# Patient Record
Sex: Female | Born: 1965 | Hispanic: No | Marital: Single | Smoking: Never smoker
Health system: Southern US, Community
[De-identification: ages and names within clinical notes are randomized; demographics above are authoritative.]

## PROBLEM LIST (undated history)

## (undated) DIAGNOSIS — J45909 Unspecified asthma, uncomplicated: Secondary | ICD-10-CM

## (undated) DIAGNOSIS — F329 Major depressive disorder, single episode, unspecified: Secondary | ICD-10-CM

## (undated) DIAGNOSIS — F32A Depression, unspecified: Secondary | ICD-10-CM

## (undated) DIAGNOSIS — F4310 Post-traumatic stress disorder, unspecified: Secondary | ICD-10-CM

## (undated) DIAGNOSIS — F419 Anxiety disorder, unspecified: Secondary | ICD-10-CM

## (undated) HISTORY — PX: BREAST REDUCTION SURGERY: SHX8

## (undated) HISTORY — PX: TUBAL LIGATION: SHX77

## (undated) HISTORY — PX: REDUCTION MAMMAPLASTY: SUR839

## (undated) HISTORY — PX: BACK SURGERY: SHX140

---

## 2016-07-04 ENCOUNTER — Emergency Department (HOSPITAL_COMMUNITY)
Admission: EM | Admit: 2016-07-04 | Discharge: 2016-07-04 | Disposition: A | Discharge status: Home / Self Care | Payer: Medicaid Other | Attending: Emergency Medicine | Admitting: Emergency Medicine

## 2016-07-04 ENCOUNTER — Encounter (HOSPITAL_COMMUNITY): Payer: Self-pay | Admitting: Emergency Medicine

## 2016-07-04 DIAGNOSIS — Z79899 Other long term (current) drug therapy: Secondary | ICD-10-CM | POA: Diagnosis not present

## 2016-07-04 DIAGNOSIS — N921 Excessive and frequent menstruation with irregular cycle: Secondary | ICD-10-CM | POA: Diagnosis not present

## 2016-07-04 DIAGNOSIS — L259 Unspecified contact dermatitis, unspecified cause: Secondary | ICD-10-CM | POA: Diagnosis not present

## 2016-07-04 DIAGNOSIS — J45909 Unspecified asthma, uncomplicated: Secondary | ICD-10-CM | POA: Insufficient documentation

## 2016-07-04 DIAGNOSIS — R21 Rash and other nonspecific skin eruption: Secondary | ICD-10-CM | POA: Diagnosis present

## 2016-07-04 HISTORY — DX: Unspecified asthma, uncomplicated: J45.909

## 2016-07-04 LAB — I-STAT CHEM 8, ED
BUN: 19 mg/dL (ref 6–20)
CREATININE: 0.7 mg/dL (ref 0.44–1.00)
Calcium, Ion: 1.26 mmol/L (ref 1.13–1.30)
Chloride: 104 mmol/L (ref 101–111)
GLUCOSE: 94 mg/dL (ref 65–99)
HEMATOCRIT: 35 % — AB (ref 36.0–46.0)
HEMOGLOBIN: 11.9 g/dL — AB (ref 12.0–15.0)
POTASSIUM: 3.6 mmol/L (ref 3.5–5.1)
Sodium: 141 mmol/L (ref 135–145)
TCO2: 24 mmol/L (ref 0–100)

## 2016-07-04 LAB — URINALYSIS, ROUTINE W REFLEX MICROSCOPIC
Bilirubin Urine: NEGATIVE
GLUCOSE, UA: NEGATIVE mg/dL
KETONES UR: NEGATIVE mg/dL
LEUKOCYTES UA: NEGATIVE
NITRITE: NEGATIVE
PH: 5.5 (ref 5.0–8.0)
Protein, ur: NEGATIVE mg/dL
SPECIFIC GRAVITY, URINE: 1.028 (ref 1.005–1.030)

## 2016-07-04 LAB — URINE MICROSCOPIC-ADD ON

## 2016-07-04 LAB — WET PREP, GENITAL
CLUE CELLS WET PREP: NONE SEEN
SPERM: NONE SEEN
TRICH WET PREP: NONE SEEN
Yeast Wet Prep HPF POC: NONE SEEN

## 2016-07-04 LAB — PREGNANCY, URINE: Preg Test, Ur: NEGATIVE

## 2016-07-04 MED ORDER — HYDROXYZINE HCL 25 MG PO TABS
25.0000 mg | ORAL_TABLET | Freq: Once | ORAL | Status: AC
  Administered 2016-07-04: 25 mg via ORAL
  Filled 2016-07-04: qty 1

## 2016-07-04 MED ORDER — PREDNISONE 20 MG PO TABS
60.0000 mg | ORAL_TABLET | Freq: Once | ORAL | Status: AC
  Administered 2016-07-04: 60 mg via ORAL
  Filled 2016-07-04: qty 3

## 2016-07-04 MED ORDER — PREDNISONE 20 MG PO TABS: 40.0000 mg | ORAL_TABLET | Freq: Every day | ORAL | 0 refills | Status: DC

## 2016-07-04 MED ORDER — HYDROCORTISONE 2.5 % EX LOTN: TOPICAL_LOTION | Freq: Two times a day (BID) | CUTANEOUS | 0 refills | Status: DC

## 2016-07-04 MED ORDER — HYDROXYZINE HCL 25 MG PO TABS: 25.0000 mg | ORAL_TABLET | Freq: Four times a day (QID) | ORAL | 0 refills | Status: DC | PRN

## 2016-07-04 NOTE — Progress Notes (Signed)
Patient listed as not having insurance or a pcp.  EDCM went to speak to patient at bedside, however, EDP at bedside performing procedure.

## 2016-07-04 NOTE — ED Triage Notes (Signed)
Patient c/o rash on neck that is starting to spread around neck and chest that has been getting worse over week. 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  Patient states itching and burning rash.  Patient also c/o that her menstrual cycle has been going on for 3 weeks straight.  C/o lower back pain x several months.

## 2016-07-04 NOTE — ED Provider Notes (Signed)
[facility] DEPT Provider Note   CSN: [id number] Arrival date & time: 07/04/16  1647  First Provider Contact:  First MD Initiated Contact with Patient 07/04/16 2010    By signing my name below, I, [name], attest that this documentation has been prepared under the direction and in the presence of [employer], PA-C. Electronically Signed: [name], ED Scribe. 07/04/16. 8:17 PM.    History   Chief Complaint Chief Complaint  Patient presents with  . Rash  . Menstrual Problem  . Back Pain    HPI Daisy Myers is a 50 y.o. female.  HPI HPI Comments: Daisy Myers is a 50 y.o. female who presents to the Emergency Department complaining of worsening burning and itching rash on her neck for the last 3 weeks. She states that the rash has been worse over the last week and spreading down onto her chest. She states that she believes that the rash may be from the chemicals she uses at work. Denies any treatments tried. Pt reports similar rash when she eats acidic foods. Denies drainage, fever, SOB, facial swelling, nausea or vomiting. Denies any new foods or medications.   Pt also reports irregular menstrual cycle. Pt states that she has been on her menstrual cycle for the last 3 weeks. Pt states that is using about 7 pads per day. Pt reports that she also has some back pain. She states that she is also passing clots. Pt denies recent sexual activity. No dyspareunia. She further denies vomiting, dysuria, syncope, SOB, or light headedness. Pt states that she does not have an OBGYN in [place].   Past Medical History:  Diagnosis Date  . Asthma     There are no active problems to display for this patient.   Past Surgical History:  Procedure Laterality Date  . BACK SURGERY    . BREAST REDUCTION SURGERY    . CESAREAN SECTION    . TUBAL LIGATION      OB History    No data available       Home Medications    Prior to Admission medications   Medication Sig Start  Date End Date Taking? Authorizing Provider  Fluticasone-Salmeterol (ADVAIR) 500-50 MCG/DOSE AEPB Inhale 1 puff into the lungs 2 (two) times daily.   Yes Historical Provider, MD  ibuprofen (ADVIL,MOTRIN) 200 MG tablet Take 400 mg by mouth every 6 (six) hours as needed for headache, mild pain or moderate pain.   Yes Historical Provider, MD  hydrocortisone 2.5 % lotion Apply topically 2 (two) times daily. Do not apply to face 07/04/16   Antonietta Breach, PA-C  hydrOXYzine (ATARAX/VISTARIL) 25 MG tablet Take 1 tablet (25 mg total) by mouth every 6 (six) hours as needed for itching. 07/04/16   Antonietta Breach, PA-C  predniSONE (DELTASONE) 20 MG tablet Take 2 tablets (40 mg total) by mouth daily. Take 40 mg by mouth daily for 3 days, then 20mg  by mouth daily for 3 days, then 10mg  daily for 3 days 07/04/16   Antonietta Breach, PA-C    Family History No family history on file.  Social History Social History  Substance Use Topics  . Smoking status: Never Smoker  . Smokeless tobacco: Never Used  . Alcohol use No     Allergies   Review of patient's allergies indicates no known allergies.   Review of Systems Review of Systems  Constitutional: Negative for fever.  Respiratory: Negative for shortness of breath.   Gastrointestinal: Negative for nausea and vomiting.  Genitourinary: Positive  for vaginal bleeding. Negative for dysuria.  Skin: Positive for rash.  Neurological: Negative for light-headedness.  All other systems reviewed and are negative.    Physical Exam Updated Vital Signs BP 112/64   Pulse 66   Temp 98.7 F (37.1 C) (Oral)   Resp 20   LMP 06/13/2016   SpO2 100%   Physical Exam  Constitutional: She is oriented to person, place, and time. She appears well-developed and well-nourished. No distress.  Nontoxic appearing and in no distress.  HENT:  Head: Normocephalic and atraumatic.  No angioedema. Patient tolerating secretions without difficulty. No tripoding or stridor.  Eyes:  Conjunctivae and EOM are normal. No scleral icterus.  Neck: Normal range of motion.  No nuchal rigidity or meningismus.  Pulmonary/Chest: Effort normal. No respiratory distress.  Respirations even and unlabored.  Genitourinary: There is no rash, tenderness, lesion or injury on the right labia. There is no rash, tenderness, lesion or injury on the left labia. Uterus is not tender. Cervix exhibits no motion tenderness. Right adnexum displays no mass and no tenderness. Left adnexum displays no mass and no tenderness. There is bleeding (blood in vaginal vault c/w menses) in the vagina. No erythema or tenderness in the vagina. No foreign body in the vagina.  Genitourinary Comments: Uterus palpated to be firm, raising suspicion for fibroids.  Musculoskeletal: Normal range of motion.  Neurological: She is alert and oriented to person, place, and time.  Skin: Skin is warm and dry. Rash noted. She is not diaphoretic. No erythema. No pallor.  Pruritic, mildly erythematous, macular and punctate papular rash to upper chest and lower neck anteriorly. No skin peeling, weeping, drainage, bullae. Negative Nikolsky's sign.  Psychiatric: She has a normal mood and affect. Her behavior is normal.  Nursing note and vitals reviewed.    ED Treatments / Results  DIAGNOSTIC STUDIES: Oxygen Saturation is 99% on RA, normal by my interpretation.    COORDINATION OF CARE: 8:18 PM-Discussed treatment plan which includes prednisone and pelvic exam with pt at bedside and pt agreed to plan.     Labs (all labs ordered are listed, but only abnormal results are displayed) Labs Reviewed  WET PREP, GENITAL - Abnormal; Notable for the following:       Result Value   WBC, Wet Prep HPF POC MODERATE (*)    All other components within normal limits  URINALYSIS, ROUTINE W REFLEX MICROSCOPIC (NOT AT [facility]) - Abnormal; Notable for the following:    APPearance CLOUDY (*)    Hgb urine dipstick LARGE (*)    All other components  within normal limits  URINE MICROSCOPIC-ADD ON - Abnormal; Notable for the following:    Squamous Epithelial / LPF 6-30 (*)    Bacteria, UA MANY (*)    All other components within normal limits  I-STAT CHEM 8, ED - Abnormal; Notable for the following:    Hemoglobin 11.9 (*)    HCT 35.0 (*)    All other components within normal limits  PREGNANCY, URINE  GC/CHLAMYDIA PROBE AMP ([place]) NOT AT [facility]    EKG  EKG Interpretation None       Radiology No results found.  Procedures Procedures (including critical care time)  Medications Ordered in ED Medications  predniSONE (DELTASONE) tablet 60 mg (60 mg Oral Given 07/04/16 2037)  hydrOXYzine (ATARAX/VISTARIL) tablet 25 mg (25 mg Oral Given 07/04/16 2037)     Initial Impression / Assessment and Plan / ED Course  I have reviewed the triage vital signs  and the nursing notes.  Pertinent labs & imaging results that were available during my care of the patient were reviewed by me and considered in my medical decision making (see chart for details).  Clinical Course    Patient presents to the emergency department for 2 complaints. She expresses most concern over a rash to her neck x 3 weeks which she feels has been worsening over the past week. She states that she has been using new chemicals at her job. She has had no difficulty swallowing or breathing. No associated fever or drainage from the rash. There is no evidence of secondary infection or cellulitis today. Rash consistent mostly with a contact dermatitis. Plan to manage supportively with a prednisone taper. Atarax prescribed for itching. Will give referral to dermatology.  Patient also complaining of 3 weeks of persistent vaginal bleeding. She has no complaints of abdominal pain, nausea, vomiting, shortness of breath, lightheadedness, or syncope. Hemoglobin is only slightly below baseline. Orthostatics stable. Suspect that bleeding may be secondary to fibroids. No indication for  further emergent workup of menometrorrhagia. Patient referred to an OB/GYN for follow-up.   Return precautions discussed and provided. Patient discharged in satisfactory condition with no unaddressed concerns.   Final Clinical Impressions(s) / ED Diagnoses   Final diagnoses:  Menometrorrhagia  Contact dermatitis    New Prescriptions Discharge Medication List as of 07/04/2016 11:06 PM    START taking these medications   Details  hydrocortisone 2.5 % lotion Apply topically 2 (two) times daily. Do not apply to face, Starting Wed 07/04/2016, Print    hydrOXYzine (ATARAX/VISTARIL) 25 MG tablet Take 1 tablet (25 mg total) by mouth every 6 (six) hours as needed for itching., Starting Wed 07/04/2016, Print    predniSONE (DELTASONE) 20 MG tablet Take 2 tablets (40 mg total) by mouth daily. Take 40 mg by mouth daily for 3 days, then 20mg  by mouth daily for 3 days, then 10mg  daily for 3 days, Starting Wed 07/04/2016, Print        I personally performed the services described in this documentation, which was scribed in my presence. The recorded information has been reviewed and is accurate.       Antonietta Breach, PA-C 07/05/16 [id number]    Daleen Bo, MD 07/06/16 [phone]

## 2016-07-05 LAB — GC/CHLAMYDIA PROBE AMP (~~LOC~~) NOT AT ARMC
CHLAMYDIA, DNA PROBE: NEGATIVE
Neisseria Gonorrhea: NEGATIVE

## 2016-08-06 ENCOUNTER — Encounter: Payer: Medicaid Other | Admitting: Obstetrics & Gynecology

## 2016-10-24 ENCOUNTER — Other Ambulatory Visit: Payer: Self-pay | Admitting: Internal Medicine

## 2016-10-24 DIAGNOSIS — Z1231 Encounter for screening mammogram for malignant neoplasm of breast: Secondary | ICD-10-CM

## 2016-10-29 ENCOUNTER — Ambulatory Visit
Admission: RE | Admit: 2016-10-29 | Discharge: 2016-10-29 | Disposition: A | Discharge status: Home / Self Care | Payer: Medicaid Other | Source: Ambulatory Visit | Attending: Internal Medicine | Admitting: Internal Medicine

## 2016-10-29 ENCOUNTER — Other Ambulatory Visit: Payer: Self-pay | Admitting: Internal Medicine

## 2016-10-29 DIAGNOSIS — A1801 Tuberculosis of spine: Secondary | ICD-10-CM

## 2016-10-29 DIAGNOSIS — E2839 Other primary ovarian failure: Secondary | ICD-10-CM

## 2016-10-29 DIAGNOSIS — M25561 Pain in right knee: Secondary | ICD-10-CM

## 2016-11-13 ENCOUNTER — Ambulatory Visit
Admission: RE | Admit: 2016-11-13 | Discharge: 2016-11-13 | Disposition: A | Discharge status: Home / Self Care | Payer: Medicaid Other | Source: Ambulatory Visit | Attending: Internal Medicine | Admitting: Internal Medicine

## 2016-11-13 DIAGNOSIS — Z1231 Encounter for screening mammogram for malignant neoplasm of breast: Secondary | ICD-10-CM

## 2016-11-13 DIAGNOSIS — E2839 Other primary ovarian failure: Secondary | ICD-10-CM

## 2016-11-21 ENCOUNTER — Other Ambulatory Visit: Payer: Self-pay | Admitting: Internal Medicine

## 2016-11-21 DIAGNOSIS — R928 Other abnormal and inconclusive findings on diagnostic imaging of breast: Secondary | ICD-10-CM

## 2016-11-22 ENCOUNTER — Encounter: Payer: Self-pay | Admitting: Internal Medicine

## 2016-11-28 ENCOUNTER — Ambulatory Visit
Admission: RE | Admit: 2016-11-28 | Discharge: 2016-11-28 | Disposition: A | Discharge status: Home / Self Care | Payer: Medicaid Other | Source: Ambulatory Visit | Attending: Internal Medicine | Admitting: Internal Medicine

## 2016-11-28 ENCOUNTER — Other Ambulatory Visit: Payer: Self-pay | Admitting: Internal Medicine

## 2016-11-28 DIAGNOSIS — R928 Other abnormal and inconclusive findings on diagnostic imaging of breast: Secondary | ICD-10-CM

## 2016-11-28 DIAGNOSIS — N6002 Solitary cyst of left breast: Secondary | ICD-10-CM

## 2016-12-04 ENCOUNTER — Ambulatory Visit
Admission: RE | Admit: 2016-12-04 | Discharge: 2016-12-04 | Disposition: A | Discharge status: Home / Self Care | Payer: Medicaid Other | Source: Ambulatory Visit | Attending: Internal Medicine | Admitting: Internal Medicine

## 2016-12-04 DIAGNOSIS — N6002 Solitary cyst of left breast: Secondary | ICD-10-CM

## 2017-01-07 ENCOUNTER — Ambulatory Visit (AMBULATORY_SURGERY_CENTER): Payer: Self-pay

## 2017-01-07 VITALS — Ht 60.0 in | Wt 174.4 lb

## 2017-01-07 DIAGNOSIS — Z1211 Encounter for screening for malignant neoplasm of colon: Secondary | ICD-10-CM

## 2017-01-07 MED ORDER — SUPREP BOWEL PREP KIT 17.5-3.13-1.6 GM/177ML PO SOLN: 1.0000 | Freq: Once | ORAL | 0 refills | Status: AC

## 2017-01-07 NOTE — Progress Notes (Signed)
No allergies to eggs or soy No diet meds No home oxygen No past problems with anesthesia  Declined emmi 

## 2017-01-21 ENCOUNTER — Ambulatory Visit (AMBULATORY_SURGERY_CENTER): Payer: Medicaid Other | Admitting: Internal Medicine

## 2017-01-21 ENCOUNTER — Encounter: Payer: Self-pay | Admitting: Internal Medicine

## 2017-01-21 VITALS — BP 141/69 | HR 69 | Temp 97.8°F | Resp 12 | Ht 60.0 in | Wt 174.0 lb

## 2017-01-21 DIAGNOSIS — Z1212 Encounter for screening for malignant neoplasm of rectum: Secondary | ICD-10-CM | POA: Diagnosis not present

## 2017-01-21 DIAGNOSIS — K635 Polyp of colon: Secondary | ICD-10-CM

## 2017-01-21 DIAGNOSIS — Z1211 Encounter for screening for malignant neoplasm of colon: Secondary | ICD-10-CM

## 2017-01-21 DIAGNOSIS — D123 Benign neoplasm of transverse colon: Secondary | ICD-10-CM

## 2017-01-21 MED ORDER — SODIUM CHLORIDE 0.9 % IV SOLN: 500.0000 mL | INTRAVENOUS | Status: DC

## 2017-01-21 NOTE — Progress Notes (Signed)
Report given to PACU, vss 

## 2017-01-21 NOTE — Op Note (Signed)
[facility] Patient Name: Daisy Myers Procedure Date: 01/21/2017 1:22 PM MRN: [id number] Endoscopist: Docia Chuck. Henrene Pastor , MD Age: 51 Referring MD:  Date of Birth: 06-02-1966 Gender: Female Account #: [id number] Procedure:                Colonoscopy with cold snare polypectomy x 1 Indications:              Screening for colorectal malignant neoplasm Medicines:                Monitored Anesthesia Care Procedure:                Pre-Anesthesia Assessment:                           - Prior to the procedure, a History and Physical                            was performed, and patient medications and                            allergies were reviewed. The patient's tolerance of                            previous anesthesia was also reviewed. The risks                            and benefits of the procedure and the sedation                            options and risks were discussed with the patient.                            All questions were answered, and informed consent                            was obtained. Prior Anticoagulants: The patient has                            taken no previous anticoagulant or antiplatelet                            agents. ASA Grade Assessment: I - A normal, healthy                            patient. After reviewing the risks and benefits,                            the patient was deemed in satisfactory condition to                            undergo the procedure.                           After obtaining informed consent, the colonoscope  was passed under direct vision. Throughout the                            procedure, the patient's blood pressure, pulse, and                            oxygen saturations were monitored continuously. The                            Model CF-HQ190L [id number]) scope was introduced                            through the anus and advanced to the the cecum,   identified by appendiceal orifice and ileocecal                            valve. The ileocecal valve, appendiceal orifice,                            and rectum were photographed. The quality of the                            bowel preparation was excellent. The colonoscopy                            was performed without difficulty. The patient                            tolerated the procedure well. The bowel preparation                            used was SUPREP. Scope In: 1:29:35 PM Scope Out: 1:44:19 PM Scope Withdrawal Time: 0 hours 11 minutes 45 seconds  Total Procedure Duration: 0 hours 14 minutes 44 seconds  Findings:                 A 1 mm polyp was found in the transverse colon. The                            polyp was removed with a cold snare. Resection and                            retrieval were complete.                           The exam was otherwise without abnormality on                            direct and retroflexion views. Complications:            No immediate complications. Estimated blood loss:                            None. Estimated Blood Loss:     Estimated blood loss: none. Impression:               -  One 1 mm polyp in the transverse colon, removed                            with a cold snare. Resected and retrieved.                           - The examination was otherwise normal on direct                            and retroflexion views. Recommendation:           - Repeat colonoscopy in 5-10 years for surveillance.                           - Patient has a contact number available for                            emergencies. The signs and symptoms of potential                            delayed complications were discussed with the                            patient. Return to normal activities tomorrow.                            Written discharge instructions were provided to the                            patient.                           -  Resume previous diet.                           - Continue present medications.                           - Await pathology results. Docia Chuck. Henrene Pastor, MD 01/21/2017 1:49:10 PM This report has been signed electronically.

## 2017-01-21 NOTE — Patient Instructions (Signed)
Impression/Recommendations:  Polyp handout given to patient.  Repeat colonoscopy in 5-10 years for surveillance.  YOU HAD AN ENDOSCOPIC PROCEDURE TODAY AT [facility]:   Refer to the procedure report that was given to you for any specific questions about what was found during the examination.  If the procedure report does not answer your questions, please call your gastroenterologist to clarify.  If you requested that your care partner not be given the details of your procedure findings, then the procedure report has been included in a sealed envelope for you to review at your convenience later.  YOU SHOULD EXPECT: Some feelings of bloating in the abdomen. Passage of more gas than usual.  Walking can help get rid of the air that was put into your GI tract during the procedure and reduce the bloating. If you had a lower endoscopy (such as a colonoscopy or flexible sigmoidoscopy) you may notice spotting of blood in your stool or on the toilet paper. If you underwent a bowel prep for your procedure, you may not have a normal bowel movement for a few days.  Please Note:  You might notice some irritation and congestion in your nose or some drainage.  This is from the oxygen used during your procedure.  There is no need for concern and it should clear up in a day or so.  SYMPTOMS TO REPORT IMMEDIATELY:   Following lower endoscopy (colonoscopy or flexible sigmoidoscopy):  Excessive amounts of blood in the stool  Significant tenderness or worsening of abdominal pains  Swelling of the abdomen that is new, acute  Fever of 100F or higher For urgent or emergent issues, a gastroenterologist can be reached at any hour by calling [phone].   DIET:  We do recommend a small meal at first, but then you may proceed to your regular diet.  Drink plenty of fluids but you should avoid alcoholic beverages for 24 hours.  ACTIVITY:  You should plan to take it easy for the rest of today and you  should NOT DRIVE or use heavy machinery until tomorrow (because of the sedation medicines used during the test).    FOLLOW UP: Our staff will call the number listed on your records the next business day following your procedure to check on you and address any questions or concerns that you may have regarding the information given to you following your procedure. If we do not reach you, we will leave a message.  However, if you are feeling well and you are not experiencing any problems, there is no need to return our call.  We will assume that you have returned to your regular daily activities without incident.  If any biopsies were taken you will be contacted by phone or by letter within the next 1-3 weeks.  Please call us at [phone] if you have not heard about the biopsies in 3 weeks.    SIGNATURES/CONFIDENTIALITY: You and/or your care partner have signed paperwork which will be entered into your electronic medical record.  These signatures attest to the fact that that the information above on your After Visit Summary has been reviewed and is understood.  Full responsibility of the confidentiality of this discharge information lies with you and/or your care-partner.

## 2017-01-21 NOTE — Progress Notes (Signed)
Called to room to assist during endoscopic procedure.  Patient ID and intended procedure confirmed with present staff. Received instructions for my participation in the procedure from the performing physician.  

## 2017-01-22 ENCOUNTER — Telehealth: Payer: Self-pay | Admitting: *Deleted

## 2017-01-22 NOTE — Telephone Encounter (Signed)
  Follow up Call-  Call back number 01/21/2017  Post procedure Call Back phone  # #[phone] cell  Permission to leave phone message Yes     Patient questions:  Do you have a fever, pain , or abdominal swelling? No. Pain Score  0 *  Have you tolerated food without any problems? Yes.    Have you been able to return to your normal activities? Yes.    Do you have any questions about your discharge instructions: Diet   No. Medications  No. Follow up visit  No.  Do you have questions or concerns about your Care? No.  Actions: * If pain score is 4 or above: No action needed, pain <4.

## 2017-01-24 NOTE — Telephone Encounter (Signed)
Opened in error. Sm

## 2017-01-25 ENCOUNTER — Encounter: Payer: Self-pay | Admitting: Internal Medicine

## 2017-03-11 ENCOUNTER — Telehealth (HOSPITAL_COMMUNITY): Payer: Self-pay | Admitting: *Deleted

## 2017-03-11 ENCOUNTER — Encounter (HOSPITAL_COMMUNITY): Payer: Self-pay | Admitting: *Deleted

## 2017-03-11 NOTE — Telephone Encounter (Signed)
Telephoned patient at home number and advised of negative pap smear results. HPV was negative. Next pap smear due in five years.

## 2018-03-03 ENCOUNTER — Ambulatory Visit (INDEPENDENT_AMBULATORY_CARE_PROVIDER_SITE_OTHER): Payer: Self-pay | Admitting: Orthopaedic Surgery

## 2018-07-16 ENCOUNTER — Emergency Department (HOSPITAL_COMMUNITY)
Admission: EM | Admit: 2018-07-16 | Discharge: 2018-07-17 | Disposition: A | Discharge status: Other Acute Inpatient Hospital | Payer: Medicaid Other | Attending: Emergency Medicine | Admitting: Emergency Medicine

## 2018-07-16 ENCOUNTER — Other Ambulatory Visit: Payer: Self-pay

## 2018-07-16 ENCOUNTER — Encounter (HOSPITAL_COMMUNITY): Payer: Self-pay

## 2018-07-16 DIAGNOSIS — R45851 Suicidal ideations: Secondary | ICD-10-CM | POA: Insufficient documentation

## 2018-07-16 DIAGNOSIS — J45909 Unspecified asthma, uncomplicated: Secondary | ICD-10-CM | POA: Insufficient documentation

## 2018-07-16 DIAGNOSIS — Z008 Encounter for other general examination: Secondary | ICD-10-CM

## 2018-07-16 DIAGNOSIS — D649 Anemia, unspecified: Secondary | ICD-10-CM | POA: Insufficient documentation

## 2018-07-16 DIAGNOSIS — F419 Anxiety disorder, unspecified: Secondary | ICD-10-CM | POA: Insufficient documentation

## 2018-07-16 DIAGNOSIS — F332 Major depressive disorder, recurrent severe without psychotic features: Secondary | ICD-10-CM | POA: Insufficient documentation

## 2018-07-16 DIAGNOSIS — Z79899 Other long term (current) drug therapy: Secondary | ICD-10-CM | POA: Insufficient documentation

## 2018-07-16 HISTORY — DX: Depression, unspecified: F32.A

## 2018-07-16 HISTORY — DX: Post-traumatic stress disorder, unspecified: F43.10

## 2018-07-16 HISTORY — DX: Anxiety disorder, unspecified: F41.9

## 2018-07-16 HISTORY — DX: Major depressive disorder, single episode, unspecified: F32.9

## 2018-07-16 LAB — CBC
HCT: 36.5 % (ref 36.0–46.0)
HEMOGLOBIN: 11.1 g/dL — AB (ref 12.0–15.0)
MCH: 25.7 pg — ABNORMAL LOW (ref 26.0–34.0)
MCHC: 30.4 g/dL (ref 30.0–36.0)
MCV: 84.5 fL (ref 78.0–100.0)
PLATELETS: 298 10*3/uL (ref 150–400)
RBC: 4.32 MIL/uL (ref 3.87–5.11)
RDW: 15.7 % — ABNORMAL HIGH (ref 11.5–15.5)
WBC: 4.3 10*3/uL (ref 4.0–10.5)

## 2018-07-16 LAB — I-STAT BETA HCG BLOOD, ED (MC, WL, AP ONLY): I-stat hCG, quantitative: <5 m[IU]/mL (ref <5)

## 2018-07-16 LAB — RAPID URINE DRUG SCREEN, HOSP PERFORMED
Amphetamines: NOT DETECTED
Barbiturates: NOT DETECTED
Benzodiazepines: NOT DETECTED
COCAINE: NOT DETECTED
OPIATES: NOT DETECTED
TETRAHYDROCANNABINOL: NOT DETECTED

## 2018-07-16 LAB — COMPREHENSIVE METABOLIC PANEL
ALT: 21 U/L (ref 0–44)
AST: 19 U/L (ref 15–41)
Albumin: 3.6 g/dL (ref 3.5–5.0)
Alkaline Phosphatase: 67 U/L (ref 38–126)
Anion gap: 7 (ref 5–15)
BUN: 9 mg/dL (ref 6–20)
CHLORIDE: 107 mmol/L (ref 98–111)
CO2: 26 mmol/L (ref 22–32)
Calcium: 9.2 mg/dL (ref 8.9–10.3)
Creatinine, Ser: 0.78 mg/dL (ref 0.44–1.00)
GFR calc non Af Amer: >60 mL/min (ref >60)
Glucose, Bld: 91 mg/dL (ref 70–99)
Potassium: 3.7 mmol/L (ref 3.5–5.1)
SODIUM: 140 mmol/L (ref 135–145)
Total Bilirubin: 0.7 mg/dL (ref 0.3–1.2)
Total Protein: 7 g/dL (ref 6.5–8.1)

## 2018-07-16 LAB — ETHANOL: Alcohol, Ethyl (B): <10 mg/dL (ref <10)

## 2018-07-16 LAB — SALICYLATE LEVEL

## 2018-07-16 LAB — ACETAMINOPHEN LEVEL

## 2018-07-16 MED ORDER — ALUM & MAG HYDROXIDE-SIMETH 200-200-20 MG/5ML PO SUSP: 30.0000 mL | Freq: Four times a day (QID) | ORAL | Status: DC | PRN

## 2018-07-16 MED ORDER — IBUPROFEN 400 MG PO TABS
600.0000 mg | ORAL_TABLET | Freq: Three times a day (TID) | ORAL | Status: DC | PRN
  Administered 2018-07-16: 600 mg via ORAL
  Filled 2018-07-16: qty 1

## 2018-07-16 MED ORDER — MOMETASONE FURO-FORMOTEROL FUM 200-5 MCG/ACT IN AERO: 2.0000 | INHALATION_SPRAY | Freq: Two times a day (BID) | RESPIRATORY_TRACT | Status: DC

## 2018-07-16 MED ORDER — ZOLPIDEM TARTRATE 5 MG PO TABS
5.0000 mg | ORAL_TABLET | Freq: Every evening | ORAL | Status: DC | PRN
  Administered 2018-07-16: 5 mg via ORAL
  Filled 2018-07-16: qty 1

## 2018-07-16 MED ORDER — ONDANSETRON HCL 4 MG PO TABS: 4.0000 mg | ORAL_TABLET | Freq: Three times a day (TID) | ORAL | Status: DC | PRN

## 2018-07-16 NOTE — Progress Notes (Addendum)
Pt meets inpatient criteria per Marvia Pickles, NP. Referral information has been sent to the following hospitals for review: [place]  [facility]   CSW will continue to assist with placement needs.   Audree Camel, LCSW, [place] Disposition [facility] [facility] [phone]

## 2018-07-16 NOTE — ED Triage Notes (Signed)
Pt here with her mental health therapist for suicidal thoughts and plan to "overdose on my pills" Pt has been off of [place] meds x 1  Months, lives alone and has not been keeping her medication management appointments.VSS.

## 2018-07-16 NOTE — ED Notes (Signed)
Dinner tray delivered to [employer]

## 2018-07-16 NOTE — ED Notes (Signed)
Patient Dinner was ordered for the 2nd time, and  The same thing was given to Patient and she have stated for the 2nd time that she doesn't eat Pork.  So another Diet was called in for Chicken Pot Pie, instead of Pork Chop.

## 2018-07-16 NOTE — BH Assessment (Signed)
Assessment Note  Daisy Myers is an 52 y.o. female. Pt reports SI with a plan to overdose. Pt reports 3 previous SI attempts. Per Pt she tried to overdose today but someone stopped her. The Pt states she is tired of life. Per Pt she wants to go to sleep and never wake up. The Pt states she lives alone currently because her 73 year old daughter is currently at [facility] for mental health concerns. The Pt denies previous inpatient treatment. Pt states she is currently receiving therapy from [facility] and medication management from [facility]. Pt denies HI and AVH. Pt denies SA.  Darnelle Maffucci, NP recommends inpatient treatment.   Diagnosis:  F33.2 MDD  Past Medical History:  Past Medical History:  Diagnosis Date  . Anxiety   . Asthma   . Depression   . PTSD (post-traumatic stress disorder)     Past Surgical History:  Procedure Laterality Date  . BACK SURGERY    . BREAST REDUCTION SURGERY    . CESAREAN SECTION    . TUBAL LIGATION      Family History:  Family History  Adopted: Yes    Social History:  reports that she has never smoked. She has never used smokeless tobacco. She reports that she does not drink alcohol or use drugs.  Additional Social History:  Alcohol / Drug Use Pain Medications: please see mar Prescriptions: please see mar Over the Counter: please see mar History of alcohol / drug use?: No history of alcohol / drug abuse Longest period of sobriety (when/how long): NA  CIWA: CIWA-Ar BP: (!) 158/84 Pulse Rate: 60 COWS:    Allergies:  Allergies  Allergen Reactions  . Citrus     Rash all over  . Milk-Related Compounds Rash    Rash     Home Medications:  (Not in a hospital admission)  OB/GYN Status:  Patient's last menstrual period was 07/14/2018 (exact date).  General Assessment Data Location of Assessment: [facility] ED TTS Assessment: In system Is this a Tele or Face-to-Face Assessment?: Face-to-Face Is this an Initial Assessment or a  Re-assessment for this encounter?: Initial Assessment Marital status: Single Maiden name: NA Is patient pregnant?: No Pregnancy Status: No Living Arrangements: Alone Can pt return to current living arrangement?: Yes Admission Status: Voluntary Is patient capable of signing voluntary admission?: Yes Referral Source: Self/Family/Friend Insurance type: SP     Crisis Care Plan Living Arrangements: Alone Legal Guardian: Other:(self) Name of Psychiatrist: [facility] Name of Therapist: [facility]  Education Status Is patient currently in school?: No Is the patient employed, unemployed or receiving disability?: Unemployed  Risk to self with the past 6 months Suicidal Ideation: Yes-Currently Present Has patient been a risk to self within the past 6 months prior to admission? : Yes Suicidal Intent: Yes-Currently Present Has patient had any suicidal intent within the past 6 months prior to admission? : Yes Is patient at risk for suicide?: Yes Suicidal Plan?: Yes-Currently Present Has patient had any suicidal plan within the past 6 months prior to admission? : Yes Specify Current Suicidal Plan: to overdose Access to Means: Yes Specify Access to Suicidal Means: access to pills What has been your use of drugs/alcohol within the last 12 months?: NA Previous Attempts/Gestures: No How many times?: 0 Other Self Harm Risks: NA Triggers for Past Attempts: None known Intentional Self Injurious Behavior: None Family Suicide History: No Recent stressful life event(s): Job Loss, Conflict (Comment), Trauma (Comment) Persecutory voices/beliefs?: No Depression: Yes Depression Symptoms: Tearfulness, Isolating,  Fatigue, Loss of interest in usual pleasures, Feeling worthless/self pity, Feeling angry/irritable Substance abuse history and/or treatment for substance abuse?: No Suicide prevention information given to non-admitted patients: Not applicable  Risk to Others within the past 6  months Homicidal Ideation: No Does patient have any lifetime risk of violence toward others beyond the six months prior to admission? : No Thoughts of Harm to Others: No Current Homicidal Intent: No Current Homicidal Plan: No Access to Homicidal Means: No Identified Victim: NA History of harm to others?: No Assessment of Violence: None Noted Violent Behavior Description: NA Does patient have access to weapons?: No Criminal Charges Pending?: No Does patient have a court date: No Is patient on probation?: No  Psychosis Hallucinations: None noted Delusions: None noted  Mental Status Report Appearance/Hygiene: Unremarkable Eye Contact: Fair Motor Activity: Freedom of movement Speech: Logical/coherent Level of Consciousness: Alert Mood: Depressed Affect: Depressed Anxiety Level: Minimal Thought Processes: Coherent, Relevant Judgement: Unimpaired Orientation: Person, Place, Time, Situation Obsessive Compulsive Thoughts/Behaviors: None  Cognitive Functioning Concentration: Normal Memory: Recent Intact, Remote Intact Is patient IDD: No Is patient DD?: No Insight: Fair Impulse Control: Fair Appetite: Fair Have you had any weight changes? : No Change Sleep: No Change Total Hours of Sleep: 8 Vegetative Symptoms: None  ADLScreening [facility]) Patient's cognitive ability adequate to safely complete daily activities?: Yes Patient able to express need for assistance with ADLs?: Yes Independently performs ADLs?: Yes (appropriate for developmental age)  Prior Inpatient Therapy Prior Inpatient Therapy: No  Prior Outpatient Therapy Prior Outpatient Therapy: Yes Prior Therapy Dates: current Prior Therapy Facilty/Provider(s): [facility] Reason for Treatment: depression Does patient have an ACCT team?: No Does patient have Intensive In-House Services?  : No Does patient have [facility] services? : No Does patient have P4CC services?:  No  ADL Screening (condition at time of admission) Patient's cognitive ability adequate to safely complete daily activities?: Yes Is the patient deaf or have difficulty hearing?: No Does the patient have difficulty seeing, even when wearing glasses/contacts?: No Does the patient have difficulty concentrating, remembering, or making decisions?: No Patient able to express need for assistance with ADLs?: Yes Does the patient have difficulty dressing or bathing?: No Independently performs ADLs?: Yes (appropriate for developmental age) Does the patient have difficulty walking or climbing stairs?: No       Abuse/Neglect Assessment (Assessment to be complete while patient is alone) Abuse/Neglect Assessment Can Be Completed: Yes Physical Abuse: Denies Verbal Abuse: Denies Sexual Abuse: Denies Exploitation of patient/patient's resources: Denies     Regulatory affairs officer (For Healthcare) Does Patient Have a Medical Advance Directive?: No Would patient like information on creating a medical advance directive?: No - Patient declined    Additional Information 1:1 In Past 12 Months?: No CIRT Risk: No Elopement Risk: No Does patient have medical clearance?: Yes     Disposition:  Disposition Initial Assessment Completed for this Encounter: Yes Disposition of Patient: Admit Type of inpatient treatment program: Adult  On Site Evaluation by:   Reviewed with Physician:    Cyndia Bent 07/16/2018 6:14 PM

## 2018-07-16 NOTE — ED Notes (Signed)
Patient Doesn't eat Pork, A different Diet was ordered for Patient.

## 2018-07-16 NOTE — ED Provider Notes (Signed)
[facility] EMERGENCY DEPARTMENT Provider Note   CSN: [id number] Arrival date & time: 07/16/18  1351     History   Chief Complaint Chief Complaint  Patient presents with  . Suicidal    HPI Daisy Myers is a 52 y.o. female with a PMHx of asthma, anxiety, depression, chronic back pain, chronic knee pain, and PTSD, who presents to the ED with complaints of suicidal ideations with a plan to overdose.  Patient states that she went to her therapist's office this morning, had a "breakdown", and then came home and was going to overdose on "a bunch of pills" however the [employer] bus worker walked in on her before she was able to overdose.  Patient states that she is been having a lot of anxiety today.  She has been very stressed recently because of inability to obtain a job and having chronic pain.  She has not had her medications in about a month, she is unsure what she is supposed to be on but states that it is all prescribed to her by the [facility] clinic.  No specific treatments have been tried, no other known aggravating factors aside from the stress in her life.  She denies HI, AVH, illicit drug use, alcohol use, or tobacco use.  She has chronic right knee pain and low back pain but denies any acute changes in either of these.  She denies any other acute medical complaints at this time.  She is here voluntarily.  The history is provided by the patient and medical records. No language interpreter was used.    Past Medical History:  Diagnosis Date  . Anxiety   . Asthma   . Depression   . PTSD (post-traumatic stress disorder)     There are no active problems to display for this patient.   Past Surgical History:  Procedure Laterality Date  . BACK SURGERY    . BREAST REDUCTION SURGERY    . CESAREAN SECTION    . TUBAL LIGATION       OB History   None      Home Medications    Prior to Admission medications   Medication Sig Start Date End Date Taking?  Authorizing Provider  Fluticasone-Salmeterol (ADVAIR) 500-50 MCG/DOSE AEPB Inhale 1 puff into the lungs 2 (two) times daily.    [provider]  hydrOXYzine (ATARAX/VISTARIL) 25 MG tablet Take 1 tablet (25 mg total) by mouth every 6 (six) hours as needed for itching. 07/04/16   Antonietta Breach, PA-C  ibuprofen (ADVIL,MOTRIN) 200 MG tablet Take 400 mg by mouth every 6 (six) hours as needed for headache, mild pain or moderate pain.    [provider]    Family History Family History  Adopted: Yes    Social History Social History   Tobacco Use  . Smoking status: Never Smoker  . Smokeless tobacco: Never Used  Substance Use Topics  . Alcohol use: No  . Drug use: No     Allergies   Citrus and Milk-related compounds   Review of Systems Review of Systems  Constitutional: Negative for chills and fever.  Respiratory: Negative for shortness of breath.   Cardiovascular: Negative for chest pain.  Gastrointestinal: Negative for abdominal pain, constipation, diarrhea, nausea and vomiting.  Genitourinary: Negative for dysuria and hematuria.  Musculoskeletal: Positive for arthralgias (chronic R knee pain, not acute). Negative for myalgias.  Skin: Negative for color change.  Allergic/Immunologic: Negative for immunocompromised state.  Neurological: Negative for weakness and numbness.  Psychiatric/Behavioral: Positive for suicidal ideas. Negative for confusion and hallucinations. The patient is nervous/anxious.    All other systems reviewed and are negative for acute change except as noted in the HPI.    Physical Exam Updated Vital Signs BP (!) 158/84 (BP Location: Right Arm)   Pulse 60   Temp 98 F (36.7 C) (Oral)   Resp 16   Ht 5' (1.524 m)   Wt 78.9 kg (174 lb)   LMP 07/14/2018 (Exact Date)   SpO2 100%   BMI 33.98 kg/m   Physical Exam  Constitutional: She is oriented to person, place, and time. Vital signs are normal. She appears well-developed and  well-nourished.  Non-toxic appearance. No distress.  Afebrile, nontoxic, NAD  HENT:  Head: Normocephalic and atraumatic.  Mouth/Throat: Oropharynx is clear and moist and mucous membranes are normal.  Eyes: Conjunctivae and EOM are normal. Right eye exhibits no discharge. Left eye exhibits no discharge.  Neck: Normal range of motion. Neck supple.  Cardiovascular: Normal rate, regular rhythm, normal heart sounds and intact distal pulses. Exam reveals no gallop and no friction rub.  No murmur heard. Pulmonary/Chest: Effort normal and breath sounds normal. No respiratory distress. She has no decreased breath sounds. She has no wheezes. She has no rhonchi. She has no rales.  Abdominal: Soft. Normal appearance and bowel sounds are normal. She exhibits no distension. There is no tenderness. There is no rigidity, no rebound, no guarding, no CVA tenderness, no tenderness at McBurney's point and negative Murphy's sign.  Musculoskeletal: Normal range of motion.  Neurological: She is alert and oriented to person, place, and time. She has normal strength. No sensory deficit.  Skin: Skin is warm, dry and intact. No rash noted.  Psychiatric: She is not actively hallucinating. She exhibits a depressed mood. She expresses suicidal ideation. She expresses no homicidal ideation. She expresses suicidal plans. She expresses no homicidal plans.  Depressed affect, but pleasant and cooperative. Endorsing SI with a plan, denies HI or AVH, doesn't seem to be responding to internal stimuli.   Nursing note and vitals reviewed.    ED Treatments / Results  Labs (all labs ordered are listed, but only abnormal results are displayed) Labs Reviewed  ACETAMINOPHEN LEVEL - Abnormal; Notable for the following components:      Result Value   Acetaminophen (Tylenol), Serum <10 (*)    All other components within normal limits  CBC - Abnormal; Notable for the following components:   Hemoglobin 11.1 (*)    MCH 25.7 (*)    RDW  15.7 (*)    All other components within normal limits  COMPREHENSIVE METABOLIC PANEL  ETHANOL  SALICYLATE LEVEL  RAPID URINE DRUG SCREEN, HOSP PERFORMED  I-STAT BETA HCG BLOOD, ED ([facility], [facility], [facility] ONLY)    EKG None  Radiology No results found.  Procedures Procedures (including critical care time)  Medications Ordered in ED Medications  ibuprofen (ADVIL,MOTRIN) tablet 600 mg (has no administration in time range)  zolpidem (AMBIEN) tablet 5 mg (has no administration in time range)  ondansetron (ZOFRAN) tablet 4 mg (has no administration in time range)  alum & mag hydroxide-simeth (MAALOX/MYLANTA) 200-200-20 MG/5ML suspension 30 mL (has no administration in time range)  mometasone-formoterol (DULERA) 200-5 MCG/ACT inhaler 2 puff (has no administration in time range)     Initial Impression / Assessment and Plan / ED Course  I have reviewed the triage vital signs and the nursing notes.  Pertinent labs & imaging results that were available during my  care of the patient were reviewed by me and considered in my medical decision making (see chart for details).     52 y.o. female here with SI with a plan, was going to OD on pills but someone walked in on her. Reports anxiety. Denies HI/AVH, no illicit drugs or EtOH use, nonsmoker. Reports she's been off her meds for a month (isn't sure what she's supposed to be on). No acute medical complaints at this time. Physical exam benign aside from depressed affect. Work up thus far reveals: CBC with chronic stable anemia. CMP WNL. EtOH level undetectable. Salicylate and acetaminophen levels WNL. BetaHCG neg. UDS pending, but does not interfere with med clearance. Pt medically cleared at this time. Psych hold orders placed however home meds not reordered because pt unclear what she's on, and nothing is listed in her chart except for advair, ibuprofen, and vistaril; will defer to psych for restarting home meds, will order advair. Please see TTS notes for  further documentation of care/dispo. PLEASE NOTE THAT PT IS HERE VOLUNTARILY AT THIS TIME, IF PT TRIES TO LEAVE THEY WOULD NEED IVC PAPERWORK TAKEN OUT. Pt stable at time of med clearance.     Final Clinical Impressions(s) / ED Diagnoses   Final diagnoses:  Suicidal ideation  Anxiety  Medical clearance for psychiatric admission  Chronic anemia    ED Discharge Orders    [address] [place], [place] 07/16/18 1603    Hayden Rasmussen, MD 07/17/18 [phone]

## 2018-07-16 NOTE — ED Notes (Addendum)
Patient Made a phone call, But No One answered phone, No message was left.

## 2018-07-16 NOTE — ED Notes (Signed)
Patient's pastor called to state he will come and get patient's house keys; Patient returned called and ok'd by patient to give house keys to her pastor; This RN retrieved keys from patients belongings and verified with patient that RN has correct keys-Monique,RN

## 2018-07-17 ENCOUNTER — Inpatient Hospital Stay (HOSPITAL_COMMUNITY)
Admission: AD | Admit: 2018-07-17 | Discharge: 2018-07-25 | DRG: 885 | Disposition: A | Discharge status: Home / Self Care | Payer: Medicaid Other | Source: Intra-hospital | Attending: Psychiatry | Admitting: Psychiatry

## 2018-07-17 ENCOUNTER — Other Ambulatory Visit: Payer: Self-pay

## 2018-07-17 ENCOUNTER — Encounter (HOSPITAL_COMMUNITY): Payer: Self-pay

## 2018-07-17 DIAGNOSIS — R45851 Suicidal ideations: Secondary | ICD-10-CM | POA: Diagnosis present

## 2018-07-17 DIAGNOSIS — F419 Anxiety disorder, unspecified: Secondary | ICD-10-CM | POA: Diagnosis present

## 2018-07-17 DIAGNOSIS — Z91018 Allergy to other foods: Secondary | ICD-10-CM | POA: Diagnosis not present

## 2018-07-17 DIAGNOSIS — R42 Dizziness and giddiness: Secondary | ICD-10-CM | POA: Diagnosis not present

## 2018-07-17 DIAGNOSIS — T426X5A Adverse effect of other antiepileptic and sedative-hypnotic drugs, initial encounter: Secondary | ICD-10-CM | POA: Diagnosis not present

## 2018-07-17 DIAGNOSIS — Z62810 Personal history of physical and sexual abuse in childhood: Secondary | ICD-10-CM | POA: Diagnosis present

## 2018-07-17 DIAGNOSIS — R7303 Prediabetes: Secondary | ICD-10-CM | POA: Diagnosis present

## 2018-07-17 DIAGNOSIS — G4700 Insomnia, unspecified: Secondary | ICD-10-CM | POA: Diagnosis present

## 2018-07-17 DIAGNOSIS — F333 Major depressive disorder, recurrent, severe with psychotic symptoms: Secondary | ICD-10-CM | POA: Diagnosis not present

## 2018-07-17 DIAGNOSIS — F339 Major depressive disorder, recurrent, unspecified: Secondary | ICD-10-CM | POA: Diagnosis present

## 2018-07-17 DIAGNOSIS — J45909 Unspecified asthma, uncomplicated: Secondary | ICD-10-CM | POA: Diagnosis present

## 2018-07-17 DIAGNOSIS — F332 Major depressive disorder, recurrent severe without psychotic features: Secondary | ICD-10-CM | POA: Diagnosis present

## 2018-07-17 DIAGNOSIS — Z91011 Allergy to milk products: Secondary | ICD-10-CM | POA: Diagnosis not present

## 2018-07-17 DIAGNOSIS — F4312 Post-traumatic stress disorder, chronic: Secondary | ICD-10-CM | POA: Diagnosis present

## 2018-07-17 MED ORDER — ALUM & MAG HYDROXIDE-SIMETH 200-200-20 MG/5ML PO SUSP: 30.0000 mL | ORAL | Status: DC | PRN

## 2018-07-17 MED ORDER — HYDROXYZINE HCL 25 MG PO TABS
25.0000 mg | ORAL_TABLET | Freq: Four times a day (QID) | ORAL | Status: DC | PRN
  Administered 2018-07-17 – 2018-07-19 (×2): 25 mg via ORAL
  Filled 2018-07-17 (×3): qty 1

## 2018-07-17 MED ORDER — HYDROXYZINE HCL 25 MG PO TABS: 25.0000 mg | ORAL_TABLET | Freq: Four times a day (QID) | ORAL | Status: DC | PRN

## 2018-07-17 MED ORDER — MAGNESIUM HYDROXIDE 400 MG/5ML PO SUSP: 30.0000 mL | Freq: Every day | ORAL | Status: DC | PRN

## 2018-07-17 MED ORDER — ACETAMINOPHEN 325 MG PO TABS
650.0000 mg | ORAL_TABLET | Freq: Four times a day (QID) | ORAL | Status: DC | PRN
  Administered 2018-07-19 – 2018-07-21 (×2): 650 mg via ORAL
  Filled 2018-07-17 (×2): qty 2

## 2018-07-17 MED ORDER — NICOTINE 21 MG/24HR TD PT24
21.0000 mg | MEDICATED_PATCH | Freq: Every day | TRANSDERMAL | Status: DC
  Filled 2018-07-17: qty 1

## 2018-07-17 MED ORDER — TRAZODONE HCL 50 MG PO TABS
50.0000 mg | ORAL_TABLET | Freq: Every evening | ORAL | Status: DC | PRN
  Administered 2018-07-17: 50 mg via ORAL
  Filled 2018-07-17: qty 1

## 2018-07-17 NOTE — ED Notes (Signed)
Heart Healthy Diet was ordered for Lunch. 

## 2018-07-17 NOTE — Progress Notes (Signed)
[facility] Notes:  (Nursing/MHT/Case Management/Adjunct)  Date:  07/17/2018  Time:  2030   Type of Therapy:  wrap up group  Participation Level:  Active  Participation Quality:  Appropriate, Attentive, Sharing and Supportive  Affect:  Appropriate  Cognitive:  Appropriate  Insight:  Improving  Engagement in Group:  Engaged  Modes of Intervention:  Clarification, Education and Support  Summary of Progress/Problems: Pt shares that she feels that it is a good thing that she is here in the hospital. If pt could change any one thing about her life it would be her chronic pain and pt is grateful for god.   Shellia Cleverly 07/17/2018, 9:57 PM

## 2018-07-17 NOTE — Progress Notes (Signed)
Patient ID: Daisy Myers, female   DOB: 1966-04-16, 52 y.o.   MRN: [id number] Patient admitted to the unit due to increase depression anxiety and suicidal ideations.  Patient states that she has been feeling increasingly depressed due to dealing with her daughter who is having mental health issues of her own.  Patient stated she became overwhelmed and wanted to take an overdose of her medications but was stopped by her transportation team.  Patient was tearful but cooperative during assessment.    Safety search complete patient found to be free of all injury and contraband. Patient admitted to the unit without incident.

## 2018-07-17 NOTE — Consult Note (Addendum)
[facility] Face-to-Face Psychiatry Consult   Reason for Consult:  Suicide plan Referring Physician:  EDP Patient Identification: Daisy Myers MRN:  [id number] Principal Diagnosis: Major depressive disorder, recurrent episode, severe ([place]) Diagnosis:   Patient Active Problem List   Diagnosis Date Noted  . Major depressive disorder, recurrent episode, severe ([place]) [F33.2] 07/17/2018    Priority: High    Total Time spent with patient: 45 minutes  Subjective:   Arlenne Kimbley is a 52 y.o. female patient admitted with suicide plan.  HPI:  52 yo female who presents with an increase in depression with suicidal ideations and multiple plans, main one is to overdose.  She has had depression and anxiety for years, recent increase due to her daughter's mental health issue and hospitalization.  Starleen goes to [facility] and also sees a psychiatrist, cannot remember her medications.  Endorses feelings of hopelessness and helplessness.  10/10 depression with anxiety.  Denies homicidal ideations, hallucinations, and substance abuse.  Past Psychiatric History: depression and anxiety  Risk to Self: Suicidal Ideation: Yes-Currently Present Suicidal Intent: Yes-Currently Present Is patient at risk for suicide?: Yes Suicidal Plan?: Yes-Currently Present Specify Current Suicidal Plan: to overdose Access to Means: Yes Specify Access to Suicidal Means: access to pills What has been your use of drugs/alcohol within the last 12 months?: NA How many times?: 0 Other Self Harm Risks: NA Triggers for Past Attempts: None known Intentional Self Injurious Behavior: None Risk to Others: Homicidal Ideation: No Thoughts of Harm to Others: No Current Homicidal Intent: No Current Homicidal Plan: No Access to Homicidal Means: No Identified Victim: NA History of harm to others?: No Assessment of Violence: None Noted Violent Behavior Description: NA Does patient have access to weapons?: No Criminal Charges Pending?:  No Does patient have a court date: No Prior Inpatient Therapy: Prior Inpatient Therapy: No Prior Outpatient Therapy: Prior Outpatient Therapy: Yes Prior Therapy Dates: current Prior Therapy Facilty/Provider(s): [facility] Reason for Treatment: depression Does patient have an ACCT team?: No Does patient have Intensive In-House Services?  : No Does patient have [facility] services? : No Does patient have P4CC services?: No  Past Medical History:  Past Medical History:  Diagnosis Date  . Anxiety   . Asthma   . Depression   . PTSD (post-traumatic stress disorder)     Past Surgical History:  Procedure Laterality Date  . BACK SURGERY    . BREAST REDUCTION SURGERY    . CESAREAN SECTION    . TUBAL LIGATION     Family History:  Family History  Adopted: Yes   Family Psychiatric  History: daughter with depression Social History:  Social History   Substance and Sexual Activity  Alcohol Use No     Social History   Substance and Sexual Activity  Drug Use No    Social History   Socioeconomic History  . Marital status: Single    Spouse name: Not on file  . Number of children: Not on file  . Years of education: Not on file  . Highest education level: Not on file  Occupational History  . Not on file  Social Needs  . Financial resource strain: Not on file  . Food insecurity:    Worry: Not on file    Inability: Not on file  . Transportation needs:    Medical: Not on file    Non-medical: Not on file  Tobacco Use  . Smoking status: Never Smoker  . Smokeless tobacco: Never Used  Substance  and Sexual Activity  . Alcohol use: No  . Drug use: No  . Sexual activity: Not on file  Lifestyle  . Physical activity:    Days per week: Not on file    Minutes per session: Not on file  . Stress: Not on file  Relationships  . Social connections:    Talks on phone: Not on file    Gets together: Not on file    Attends religious service: Not on file     Active member of club or organization: Not on file    Attends meetings of clubs or organizations: Not on file    Relationship status: Not on file  Other Topics Concern  . Not on file  Social History Narrative  . Not on file   Additional Social History:    Allergies:   Allergies  Allergen Reactions  . Citrus     Rash all over  . Milk-Related Compounds Rash    Rash     Labs:  Results for orders placed or performed during the hospital encounter of 07/16/18 (from the past 48 hour(s))  Comprehensive metabolic panel     Status: None   Collection Time: 07/16/18  2:09 PM  Result Value Ref Range   Sodium 140 135 - 145 mmol/L   Potassium 3.7 3.5 - 5.1 mmol/L   Chloride 107 98 - 111 mmol/L   CO2 26 22 - 32 mmol/L   Glucose, Bld 91 70 - 99 mg/dL   BUN 9 6 - 20 mg/dL   Creatinine, Ser 0.78 0.44 - 1.00 mg/dL   Calcium 9.2 8.9 - 10.3 mg/dL   Total Protein 7.0 6.5 - 8.1 g/dL   Albumin 3.6 3.5 - 5.0 g/dL   AST 19 15 - 41 U/L   ALT 21 0 - 44 U/L   Alkaline Phosphatase 67 38 - 126 U/L   Total Bilirubin 0.7 0.3 - 1.2 mg/dL   GFR calc non Af Amer >60 >60 mL/min   GFR calc Af Amer >60 >60 mL/min    Comment: (NOTE) The eGFR has been calculated using the CKD EPI equation. This calculation has not been validated in all clinical situations. eGFR's persistently <60 mL/min signify possible Chronic Kidney Disease.    Anion gap 7 5 - 15    Comment: Performed at [facility] [address], [place], [place] [address]  Ethanol     Status: None   Collection Time: 07/16/18  2:09 PM  Result Value Ref Range   Alcohol, Ethyl (B) <10 <10 mg/dL    Comment: (NOTE) Lowest detectable limit for serum alcohol is 10 mg/dL. For medical purposes only. Performed at [facility], [place] [address] [place], [place] [address]   Salicylate level     Status: None   Collection Time: 07/16/18  2:09 PM  Result Value Ref Range   Salicylate Lvl <9.3 2.8 - 30.0 mg/dL    Comment: Performed at [facility] [address], [place],  [address]  Acetaminophen level     Status: Abnormal   Collection Time: 07/16/18  2:09 PM  Result Value Ref Range   Acetaminophen (Tylenol), Serum <10 (L) 10 - 30 ug/mL    Comment: (NOTE) Therapeutic concentrations vary significantly. A range of 10-30 ug/mL  may be an effective concentration for many patients. However, some  are best treated at concentrations outside of this range. Acetaminophen concentrations >150 ug/mL at 4 hours after ingestion  and >50 ug/mL  at 12 hours after ingestion are often associated with  toxic reactions. Performed at [facility], [place] [address], [place], [place] [address]   cbc     Status: Abnormal   Collection Time: 07/16/18  2:09 PM  Result Value Ref Range   WBC 4.3 4.0 - 10.5 K/uL   RBC 4.32 3.87 - 5.11 MIL/uL   Hemoglobin 11.1 (L) 12.0 - 15.0 g/dL   HCT 36.5 36.0 - 46.0 %   MCV 84.5 78.0 - 100.0 fL   MCH 25.7 (L) 26.0 - 34.0 pg   MCHC 30.4 30.0 - 36.0 g/dL   RDW 15.7 (H) 11.5 - 15.5 %   Platelets 298 150 - 400 K/uL    Comment: Performed at [facility] [address] [place], [place] [address]  I-Stat beta hCG blood, ED     Status: None   Collection Time: 07/16/18  2:20 PM  Result Value Ref Range   I-stat hCG, quantitative <5.0 <5 mIU/mL   Comment 3            Comment:   GEST. AGE      CONC.  (mIU/mL)   <=1 WEEK        5 - 50     2 WEEKS       50 - 500     3 WEEKS       100 - 10,000     4 WEEKS     1,000 - 30,000        FEMALE AND NON-PREGNANT FEMALE:     LESS THAN 5 mIU/mL   Rapid urine drug screen (hospital performed)     Status: None   Collection Time: 07/16/18  8:55 PM  Result Value Ref Range   Opiates NONE DETECTED NONE DETECTED   Cocaine NONE DETECTED NONE DETECTED   Benzodiazepines NONE DETECTED NONE DETECTED   Amphetamines NONE DETECTED NONE DETECTED   Tetrahydrocannabinol NONE DETECTED NONE DETECTED   Barbiturates NONE DETECTED NONE DETECTED    Comment: (NOTE) DRUG  SCREEN FOR MEDICAL PURPOSES ONLY.  IF CONFIRMATION IS NEEDED FOR ANY PURPOSE, NOTIFY LAB WITHIN 5 DAYS. LOWEST DETECTABLE LIMITS FOR URINE DRUG SCREEN Drug Class                     Cutoff (ng/mL) Amphetamine and metabolites    1000 Barbiturate and metabolites    200 Benzodiazepine                 419 Tricyclics and metabolites     300 Opiates and metabolites        300 Cocaine and metabolites        300 THC                            50 Performed at [facility], [place] [address] [place], [place] [address]     Current Facility-Administered Medications  Medication Dose Route Frequency Provider Last Rate Last Dose  . 0.9 %  sodium chloride infusion  500 mL Intravenous Continuous Irene Shipper, MD      . alum & mag hydroxide-simeth (MAALOX/MYLANTA) 200-200-20 MG/5ML suspension 30 mL  30 mL Oral Q6H PRN Street, [place], [place]      . ibuprofen (ADVIL,MOTRIN) tablet 600 mg  600 mg Oral Q8H PRN Street, [place], [place]   600 mg at 07/16/18 2301  . ondansetron (ZOFRAN) tablet 4 mg  4  mg Oral Q8H PRN Street, [place], [place]       No current outpatient medications on file.    Musculoskeletal: Strength & Muscle Tone: within normal limits Gait & Station: normal Patient leans: N/A  Psychiatric Specialty Exam: Physical Exam  Nursing note and vitals reviewed. Constitutional: She is oriented to person, place, and time. She appears well-developed and well-nourished.  HENT:  Head: Normocephalic.  Neck: Normal range of motion.  Respiratory: Effort normal.  Musculoskeletal: Normal range of motion.  Neurological: She is alert and oriented to person, place, and time.  Psychiatric: Her speech is normal and behavior is normal. Judgment normal. Her mood appears anxious. Her affect is blunt. Cognition and memory are normal. She exhibits a depressed mood. She expresses suicidal ideation. She expresses suicidal plans.    Review of Systems  Psychiatric/Behavioral: Positive for depression and  suicidal ideas. The patient is nervous/anxious.   All other systems reviewed and are negative.   Blood pressure (!) 146/84, pulse 72, temperature 98.2 F (36.8 C), temperature source Oral, resp. rate 18, height 5' (1.524 m), weight 78.9 kg, last menstrual period 07/14/2018, SpO2 99 %.Body mass index is 33.98 kg/m.  General Appearance: Casual  Eye Contact:  Fair  Speech:  Normal Rate  Volume:  Normal  Mood:  Anxious and Depressed  Affect:  Blunt  Thought Process:  Coherent and Descriptions of Associations: Intact  Orientation:  Full (Time, Place, and Person)  Thought Content:  WDL and Logical  Suicidal Thoughts:  No  Homicidal Thoughts:  No  Memory:  Immediate;   Fair Recent;   Fair Remote;   Fair  Judgement:  Fair  Insight:  Fair  Psychomotor Activity:  Decreased  Concentration:  Concentration: Fair and Attention Span: Fair  Recall:  [employer] of Knowledge:  Fair  Language:  Good  Akathisia:  No  Handed:  Right  AIMS (if indicated):     Assets:  Leisure Time Physical Health Resilience Social Support  ADL's:  Intact  Cognition:  WNL  Sleep:        Treatment Plan Summary: Daily contact with patient to assess and evaluate symptoms and progress in treatment, Medication management and Plan major depressive disorder, severe, without psychosis:  -Vistaril 25 mg every six hours PRN anxiety -other medications need to be obtained from [facility]  Disposition: Recommend psychiatric Inpatient admission when medically cleared.  Waylan Boga, NP 07/17/2018 9:50 AM   Agree with NP assessment

## 2018-07-17 NOTE — Progress Notes (Signed)
Pt accepted to [facility], Bed 405-2  Waylan Boga, NP is the accepting provider.  Neita Garnet, MD is the attending provider.  Call report to [phone]  [facility]  ED notified.   Pt is Voluntary.  Pt may be transported by [facility] Pt scheduled  to arrive at [facility] @ !:30 PM  Romie Minus T. Judi Cong, MSW, [facility] Disposition Clinical Social Work [phone] (cell) [phone] (office)

## 2018-07-17 NOTE — ED Notes (Signed)
[facility] here to take pt to [facility]

## 2018-07-17 NOTE — ED Notes (Signed)
Up to shower, pt  Requested different breakfast which was ordered

## 2018-07-17 NOTE — Tx Team (Signed)
Initial Treatment Plan 07/17/2018 4:53 PM [name] FMB:[id number]    PATIENT STRESSORS: Marital or family conflict   PATIENT STRENGTHS: Ability for insight Communication skills General fund of knowledge   PATIENT IDENTIFIED PROBLEMS: "I have been depressed for a long time"  "I cant deal with my daughter's issues."                   DISCHARGE CRITERIA:  Ability to meet basic life and health needs Motivation to continue treatment in a less acute level of care Verbal commitment to aftercare and medication compliance  PRELIMINARY DISCHARGE PLAN: Return to previous living arrangement  PATIENT/FAMILY INVOLVEMENT: This treatment plan has been presented to and reviewed with the patient, Daisy Myers, and/or family member,The patient and family have been given the opportunity to ask questions and make suggestions.  Clarita Crane, RN 07/17/2018, 4:53 PM

## 2018-07-18 DIAGNOSIS — F333 Major depressive disorder, recurrent, severe with psychotic symptoms: Secondary | ICD-10-CM

## 2018-07-18 MED ORDER — RISPERIDONE 1 MG PO TBDP
1.0000 mg | ORAL_TABLET | Freq: Every day | ORAL | Status: DC
  Administered 2018-07-18 – 2018-07-22 (×5): 1 mg via ORAL
  Filled 2018-07-18 (×8): qty 1

## 2018-07-18 MED ORDER — GABAPENTIN 100 MG PO CAPS
100.0000 mg | ORAL_CAPSULE | Freq: Three times a day (TID) | ORAL | Status: DC
  Administered 2018-07-18 – 2018-07-21 (×9): 100 mg via ORAL
  Filled 2018-07-18 (×15): qty 1

## 2018-07-18 MED ORDER — DULOXETINE HCL 30 MG PO CPEP
30.0000 mg | ORAL_CAPSULE | Freq: Every day | ORAL | Status: DC
  Administered 2018-07-18 – 2018-07-21 (×4): 30 mg via ORAL
  Filled 2018-07-18 (×7): qty 1

## 2018-07-18 NOTE — Progress Notes (Signed)
D Pt is observed OOB UAL on the 400 hall today- she tolerates this well. SHe is adjusting to hospital milieu.     A She completed her daily assessment and on this she wrote she denied SI today and she rated her depression, hopelessness and anxiety " 9/8/8/", respectively.     R Safety is in place.

## 2018-07-18 NOTE — Progress Notes (Signed)
Recreation Therapy Notes  Date: 8.9.19 Time: 0930 Location: 300 Hall Dayroom  Group Topic: Stress Management  Goal Area(s) Addresses:  Patient will verbalize importance of using healthy stress management.  Patient will identify positive emotions associated with healthy stress management.   Intervention: Stress Management  Activity :  Guided Imagery.  LRT introduced the stress management technique of guided imagery.  LRT read a script to guide patients on mental vacation through a wildlife sanctuary.  Patients were to listen and follow along as LRT read script.  Education:  Stress Management, Discharge Planning.   Education Outcome: Acknowledges edcuation/In group clarification offered/Needs additional education  Clinical Observations/Feedback: Pt did not attend group.     Victorino Sparrow, LRT/CTRS         Ria Comment, Jnae Thomaston A 07/18/2018 11:11 AM

## 2018-07-18 NOTE — Progress Notes (Signed)
Pt is new to the unit this afternoon.  She reports that she feels safe on the unit and denies SI at this time.  She also denies HI/AVH.  She voices no needs or concerns.  She went to the evening group.  Pt was given meds for anxiety and sleep at bedtime.  Support and encouragement offered.  Pt makes her needs known to staff.  Discharge plans are in process.  Safety maintained with q15 minute checks.

## 2018-07-18 NOTE — [facility] Group Notes (Signed)
  [facility] LCSW Group Therapy Note  Date/Time: 07/18/18, 1315  Type of Therapy/Topic:  Group Therapy:  Emotion Regulation  Participation Level:  Minimal   Mood:pleasant  Description of Group:    The purpose of this group is to assist patients in learning to regulate negative emotions and experience positive emotions. Patients will be guided to discuss ways in which they have been vulnerable to their negative emotions. These vulnerabilities will be juxtaposed with experiences of positive emotions or situations, and patients challenged to use positive emotions to combat negative ones. Special emphasis will be placed on coping with negative emotions in conflict situations, and patients will process healthy conflict resolution skills.  Therapeutic Goals: 1. Patient will identify two positive emotions or experiences to reflect on in order to balance out negative emotions:  2. Patient will label two or more emotions that they find the most difficult to experience:  3. Patient will be able to demonstrate positive conflict resolution skills through discussion or role plays:   Summary of Patient Progress:Pt shared that anxiety is an emotion that is difficult for her to experience.  Pt had eyes closed for much of group and did not participate in discussion.       Therapeutic Modalities:   Cognitive Behavioral Therapy Feelings Identification Dialectical Behavioral Therapy  Lurline Idol, LCSW
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
## 2018-07-18 NOTE — Plan of Care (Signed)
  Problem: Education: Goal: Emotional status will improve Outcome: Progressing   

## 2018-07-18 NOTE — Tx Team (Addendum)
Interdisciplinary Treatment and Diagnostic Plan Update  07/18/2018 Time of Session: [place] MRN: [id number]  Principal Diagnosis: <principal problem not specified>  Secondary Diagnoses: Active Problems:   Major depressive disorder, recurrent episode ([place])   Current Medications:  Current Facility-Administered Medications  Medication Dose Route Frequency Provider Last Rate Last Dose  . acetaminophen (TYLENOL) tablet 650 mg  650 mg Oral Q6H PRN Nwoko, Agnes I, NP      . alum & mag hydroxide-simeth (MAALOX/MYLANTA) 200-200-20 MG/5ML suspension 30 mL  30 mL Oral Q4H PRN Lindell Spar I, NP      . DULoxetine (CYMBALTA) DR capsule 30 mg  30 mg Oral Daily Cobos, Fernando A, MD      . gabapentin (NEURONTIN) capsule 100 mg  100 mg Oral TID Cobos, Fernando A, MD      . hydrOXYzine (ATARAX/VISTARIL) tablet 25 mg  25 mg Oral Q6H PRN Lindell Spar I, NP   25 mg at 07/17/18 2113  . magnesium hydroxide (MILK OF MAGNESIA) suspension 30 mL  30 mL Oral Daily PRN Nwoko, Agnes I, NP      . risperiDONE (RISPERDAL M-TABS) disintegrating tablet 1 mg  1 mg Oral QHS Cobos, Fernando A, MD      . traZODone (DESYREL) tablet 50 mg  50 mg Oral QHS PRN Lindell Spar I, NP   50 mg at 07/17/18 2113   PTA Medications: Facility-Administered Medications Prior to Admission  Medication Dose Route Frequency Provider Last Rate Last Dose  . 0.9 %  sodium chloride infusion  500 mL Intravenous Continuous Irene Shipper, MD       No medications prior to admission.    Patient Stressors: Marital or family conflict  Patient Strengths: Ability for Engineer, manufacturing fund of knowledge  Treatment Modalities: Medication Management, Group therapy, Case management,  1 to 1 session with clinician, Psychoeducation, Recreational therapy.   Physician Treatment Plan for Primary Diagnosis: <principal problem not specified> Long Term Goal(s): Improvement in symptoms so as ready for discharge Improvement in  symptoms so as ready for discharge   Short Term Goals: Ability to identify changes in lifestyle to reduce recurrence of condition will improve Ability to identify and develop effective coping behaviors will improve Ability to identify changes in lifestyle to reduce recurrence of condition will improve Ability to verbalize feelings will improve Ability to disclose and discuss suicidal ideas Ability to demonstrate self-control will improve Ability to identify and develop effective coping behaviors will improve Ability to maintain clinical measurements within normal limits will improve  Medication Management: Evaluate patient's response, side effects, and tolerance of medication regimen.  Therapeutic Interventions: 1 to 1 sessions, Unit Group sessions and Medication administration.  Evaluation of Outcomes: Not Met  Physician Treatment Plan for Secondary Diagnosis: Active Problems:   Major depressive disorder, recurrent episode ([place])  Long Term Goal(s): Improvement in symptoms so as ready for discharge Improvement in symptoms so as ready for discharge   Short Term Goals: Ability to identify changes in lifestyle to reduce recurrence of condition will improve Ability to identify and develop effective coping behaviors will improve Ability to identify changes in lifestyle to reduce recurrence of condition will improve Ability to verbalize feelings will improve Ability to disclose and discuss suicidal ideas Ability to demonstrate self-control will improve Ability to identify and develop effective coping behaviors will improve Ability to maintain clinical measurements within normal limits will improve     Medication Management: Evaluate patient's response, side effects, and tolerance of medication regimen.  Therapeutic Interventions: 1 to 1 sessions, Unit Group sessions and Medication administration.  Evaluation of Outcomes: Not Met   RN Treatment Plan for Primary Diagnosis: <principal  problem not specified> Long Term Goal(s): Knowledge of disease and therapeutic regimen to maintain health will improve  Short Term Goals: Ability to identify and develop effective coping behaviors will improve and Compliance with prescribed medications will improve  Medication Management: RN will administer medications as ordered by provider, will assess and evaluate patient's response and provide education to patient for prescribed medication. RN will report any adverse and/or side effects to prescribing provider.  Therapeutic Interventions: 1 on 1 counseling sessions, Psychoeducation, Medication administration, Evaluate responses to treatment, Monitor vital signs and CBGs as ordered, Perform/monitor CIWA, COWS, AIMS and Fall Risk screenings as ordered, Perform wound care treatments as ordered.  Evaluation of Outcomes: Not Met   LCSW Treatment Plan for Primary Diagnosis: <principal problem not specified> Long Term Goal(s): Safe transition to appropriate next level of care at discharge, Engage patient in therapeutic group addressing interpersonal concerns.  Short Term Goals: Engage patient in aftercare planning with referrals and resources, Increase social support and Increase skills for wellness and recovery  Therapeutic Interventions: Assess for all discharge needs, 1 to 1 time with Social worker, Explore available resources and support systems, Assess for adequacy in community support network, Educate family and significant other(s) on suicide prevention, Complete Psychosocial Assessment, Interpersonal group therapy.  Evaluation of Outcomes: Not Met   Progress in Treatment: Attending groups: Yes. Participating in groups: Yes. Taking medication as prescribed: Yes. Toleration medication: Yes. Family/Significant other contact made: No, will contact:  pastor Patient understands diagnosis: Yes. Discussing patient identified problems/goals with staff: Yes. Medical problems stabilized or  resolved: Yes. Denies suicidal/homicidal ideation: Yes. Issues/concerns per patient self-inventory: No. Other: none  New problem(s) identified: No, Describe:  none  New Short Term/Long Term Goal(s):  Patient Goals:  Not to harm myself. Discharge Plan or Barriers:   Reason for Continuation of Hospitalization: Depression Medication stabilization  Estimated Length of Stay: 3-5 days.  Attendees: Patient: Daisy Myers 07/18/2018   Physician: Dr. Parke Poisson, MD 07/18/2018   Nursing: Neldon Newport, RN 07/18/2018   RN Care Manager: 07/18/2018   Social Worker: Lurline Idol, LCSW 07/18/2018   Recreational Therapist:  07/18/2018   Other:  07/18/2018   Other:  07/18/2018   Other: 07/18/2018        Scribe for Treatment Team: Joanne Chars, LCSW 07/18/2018 3:10 PM

## 2018-07-19 LAB — PROLACTIN: Prolactin: 26.1 ng/mL — ABNORMAL HIGH (ref 4.8–23.3)

## 2018-07-19 LAB — LIPID PANEL
CHOLESTEROL: 206 mg/dL — AB (ref 0–200)
HDL: 57 mg/dL (ref >40)
LDL Cholesterol: 133 mg/dL — ABNORMAL HIGH (ref 0–99)
Total CHOL/HDL Ratio: 3.6 RATIO
Triglycerides: 82 mg/dL (ref <150)
VLDL: 16 mg/dL (ref 0–40)

## 2018-07-19 LAB — HEMOGLOBIN A1C
Hgb A1c MFr Bld: 6 % — ABNORMAL HIGH (ref 4.8–5.6)
MEAN PLASMA GLUCOSE: 125.5 mg/dL

## 2018-07-19 LAB — TSH: TSH: 2.214 u[IU]/mL (ref 0.350–4.500)

## 2018-07-19 MED ORDER — TRAZODONE HCL 100 MG PO TABS
100.0000 mg | ORAL_TABLET | Freq: Every evening | ORAL | Status: DC | PRN
  Administered 2018-07-19 – 2018-07-24 (×6): 100 mg via ORAL
  Filled 2018-07-19 (×5): qty 1
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
## 2018-07-19 NOTE — Progress Notes (Signed)
D.  Pt pleasant on approach, no complaints voiced.  Pt was positive for evening wrap up group, observed engaged in appropriate interaction with peers on the unit.  Pt did have questions about her medication and requested print outs on newly prescribed meds.  Pt denies SI/HI/AVH at this time.  A.  Support and encouragement offered, medication given as ordered.  Printouts to be supplied as per Pt request..  R.  Pt remains safe on the unit, will continue to monitor.

## 2018-07-19 NOTE — Progress Notes (Addendum)
D Pt is observed OOB UAL on the 400 hall today...she tolerates this well. She remains flat, depressed and avoids making eye contact with this Probation officer.     A SHE completed her daily asessment and on this she wrote she deneid SI today and she rated her depression, hopelessness and anxiety " 9/9/8", respectively. EKG  was performed and result shown to MD. No changes in poc were made. Pt shared stories of her past - her feelings and how they related to her unhealthy behaviors and group offered suggestions to her.    R Safety is in place.

## 2018-07-19 NOTE — Plan of Care (Signed)
  Problem: Education: Goal: Knowledge of [facility] Education information/materials will improve Outcome: Progressing   Problem: Activity: Goal: Interest or engagement in activities will improve Outcome: Progressing Goal: Sleeping patterns will improve Outcome: Progressing

## 2018-07-19 NOTE — Progress Notes (Signed)
Pt reports she is doing fine this evening.  When asked if she had any questions or concerns, pt stated she wanted a copy of the self assessment that they fill out in the mornings.  The previous RN reported that pt had already asked prior to shift change.  Writer told her she would be getting another in the morning.  She then later asked the hall MHT and then another RN after she had already asked Probation officer.  Not sure why pt is so focused on the form.  She was also asking for results of labs which when writer checked her labs, the blood draw had just been done only just before shift change.  Pt was informed that there were no results yet.  Pt has labs scheduled for the morning also.  Pt seems to stay to herself on the hall with minimal interaction with other patients.  She denies SI/HI/AVH.  Support and encouragement offered.  Discharge plans are in process.  Safety maintained with q15 minute checks.

## 2018-07-19 NOTE — [facility] Group Notes (Signed)
[facility] Group Notes:  (Nursing/MHT/Case Management/Adjunct)  Date:  07/19/2018  Time:  1:57 PM  Type of Therapy:  Psychoeducational Skills  Participation Level:  Active  Participation Quality:  Appropriate  Affect:  Appropriate  Cognitive:  Appropriate  Insight:  Appropriate  Engagement in Group:  Engaged  Modes of Intervention:  Problem-solving  Summary of Progress/Problems: Pt attended Psychoeducational group with top topic anger management.   Benancio Deeds Shanta 07/19/2018, 1:57 PM

## 2018-07-19 NOTE — Plan of Care (Signed)
  Problem: Education: Goal: Knowledge of [facility] Education information/materials will improve Outcome: Progressing   
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
## 2018-07-20 NOTE — Progress Notes (Signed)
D Pt is observed OOB UAL on the 400 hall today...she is seen socializing with her peers in the dayroom. She smiles broadly when she greets this nurse at the med window at 0800. SHe says " I finally got to sleep last night".      A She completed her daily assessment and on this she wrote she denied SI today and she rated her depression, hopelessness and anxeity " 7/5/5/", respectively. She reprots to this Probation officer that she and her physician ae planning for ehr dc home tomorrow.     R Safety is in place.

## 2018-07-20 NOTE — BHH Group Notes (Signed)
[facility] LCSW Group Therapy Note  Date/Time:  07/20/2018 10:00-11:00AM  Type of Therapy and Topic:  Group Therapy:  Healthy and Unhealthy Supports  Participation Level:  Active   Description of Group:  Patients in this group were introduced to the idea of adding a variety of healthy supports to address the various needs in their lives.Patients discussed what additional healthy supports could be helpful in their recovery and wellness after discharge in order to prevent future hospitalizations.   An emphasis was placed on using counselor, doctor, therapy groups, 12-step groups, and problem-specific support groups to expand supports.    Therapeutic Goals:   1)  discuss importance of adding supports to stay well once out of the hospital  2)  compare healthy versus unhealthy supports and identify some examples of each  3)  generate ideas and descriptions of healthy supports that can be added  4)  offer mutual support about how to help supports to understand the mental health issues faced   5)  encourage active participation in and adherence to discharge plan    Summary of Patient Progress:  The patient stated that current healthy supports in her life are "somewhat" her son and her pastor while current unhealthy supports include the whole world which she stated sees her and others with mental illness as "an alien."  She monopolized group for some length of time explaining this theory, and while group members listened and were open to her statements, even expressed their agreement, as did CSW, she was not open to hearing any statements about seeing things differently, explaining things to people in our lives, and such.  She stated that people "don't want to hear it."  The patient expressed a willingness to add outside groups as support(s) to help in her recovery journey.  She expressed great gratitude for being in the hospital and said that she has never felt listened to or loved like she currently  does.   Therapeutic Modalities:   Motivational Interviewing Brief Solution-Focused Therapy  Selmer Dominion, LCSW

## 2018-07-20 NOTE — Progress Notes (Signed)
Patient ID: Daisy Myers, female   DOB: Jul 02, 1966, 52 y.o.   MRN: [id number] D: Patient interacting with peers in [place]. Pt reports  having a good time getting to know everyone but is worried about life after discharge. Pt reports she does not have friends and no support system. Pt reports she wished she can have a dog to keep her company but  her current residence does not allow pets. Pt rated her depression as 5 and anxiety as 6 on a 0-10 scale.  Denies  SI/HI/AVH and pain.No behavioral issues noted.  A: Support and encouragement offered as needed to express needs. Medications administered as prescribed.  R: Patient is safe and cooperative on unit. Will continue to monitor  for safety and stability.

## 2018-07-20 NOTE — BHH Group Notes (Signed)
[facility] Group Notes:  (Nursing/MHT/Case Management/Adjunct)  Date:  07/20/2018  Time:  2:02 PM  Type of Therapy:  Psychoeducational Skills  Participation Level:  Active  Participation Quality:  Appropriate  Affect:  Appropriate  Cognitive:  Appropriate  Insight:  Appropriate  Engagement in Group:  Engaged  Modes of Intervention:  Problem-solving  Summary of Progress/Problems: Pt attended Psychoeducational group with top topic healthy support systems.  Daisy Myers Shanta 07/20/2018, 2:02 PM

## 2018-07-20 NOTE — Plan of Care (Signed)
  Problem: Education: Goal: Emotional status will improve Outcome: Progressing   
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
## 2018-07-21 MED ORDER — ALBUTEROL SULFATE HFA 108 (90 BASE) MCG/ACT IN AERS
1.0000 | INHALATION_SPRAY | RESPIRATORY_TRACT | Status: DC | PRN
  Administered 2018-07-21 (×2): 2 via RESPIRATORY_TRACT
  Filled 2018-07-21: qty 6.7

## 2018-07-21 MED ORDER — GABAPENTIN 300 MG PO CAPS
300.0000 mg | ORAL_CAPSULE | Freq: Three times a day (TID) | ORAL | Status: DC
  Administered 2018-07-21 – 2018-07-22 (×3): 300 mg via ORAL
  Filled 2018-07-21 (×8): qty 1

## 2018-07-21 MED ORDER — DULOXETINE HCL 20 MG PO CPEP
40.0000 mg | ORAL_CAPSULE | Freq: Every day | ORAL | Status: DC
  Administered 2018-07-22 – 2018-07-25 (×4): 40 mg via ORAL
  Filled 2018-07-21 (×6): qty 2

## 2018-07-21 NOTE — BHH Group Notes (Signed)
[facility] LCSW Group Therapy Note  Date/Time: 07/21/18, 1315  Type of Therapy and Topic:  Group Therapy:  Overcoming Obstacles  Participation Level:  Did not attend  Description of Group:    In this group patients will be encouraged to explore what they see as obstacles to their own wellness and recovery. They will be guided to discuss their thoughts, feelings, and behaviors related to these obstacles. The group will process together ways to cope with barriers, with attention given to specific choices patients can make. Each patient will be challenged to identify changes they are motivated to make in order to overcome their obstacles. This group will be process-oriented, with patients participating in exploration of their own experiences as well as giving and receiving support and challenge from other group members.  Therapeutic Goals: 1. Patient will identify personal and current obstacles as they relate to admission. 2. Patient will identify barriers that currently interfere with their wellness or overcoming obstacles.  3. Patient will identify feelings, thought process and behaviors related to these barriers. 4. Patient will identify two changes they are willing to make to overcome these obstacles:    Summary of Patient Progress      Therapeutic Modalities:   Cognitive Behavioral Therapy Solution Focused Therapy Motivational Interviewing Relapse Prevention Therapy  Lurline Idol, LCSW

## 2018-07-21 NOTE — Progress Notes (Signed)
Patient ID: Daisy Myers, female   DOB: April 01, 1966, 52 y.o.   MRN: [id number] D: Patient alert interacting well with peers in dayroom. Pt reports she happy she was able to express to the provider the need for a therapy dog. Pt stated if approved that will help her with her loneliness. Denies  SI/HI/AVH and pain.No behavioral issues noted.  A: Support and encouragement offered as needed to express needs. Medications administered as prescribed.  R: Patient is safe and cooperative on unit. Will continue to monitor  for safety and stability.

## 2018-07-21 NOTE — BHH Group Notes (Signed)
[facility] Group Notes:   Nursing Group  Date:  07/21/2018  Time:  4:30 p.m.  Type of Therapy:  Psychoeducational Skills  Participation Level:  Active  Participation Quality:  Appropriate and Attentive  Affect:  Appropriate  Cognitive:  Alert and Appropriate  Insight:  Appropriate  Engagement in Group:  Engaged  Modes of Intervention:  Support  Summary of Progress/Problems: Discussed mental illness vs mental wellness.  Patient was able to identify some good examples.    Zipporah Plants 07/21/2018, 6:18 PM

## 2018-07-21 NOTE — BHH Suicide Risk Assessment (Signed)
[place] INPATIENT:  Family/Significant Other Suicide Prevention Education  Suicide Prevention Education:  Education Completed; Bishop Belia Heman, pastor, [phone], has been identified by the patient as the family member/significant other with whom the patient will be residing, and identified as the person(s) who will aid the patient in the event of a mental health crisis (suicidal ideations/suicide attempt).  With written consent from the patient, the family member/significant other has been provided the following suicide prevention education, prior to the and/or following the discharge of the patient.  The suicide prevention education provided includes the following:  Suicide risk factors  Suicide prevention and interventions  National Suicide Hotline telephone number  [facility] assessment telephone number  [facility] Assistance [place] and/or Residential Mobile Crisis Unit telephone number  Request made of family/significant other to:  Remove weapons (e.g., guns, rifles, knives), all items previously/currently identified as safety concern.  No guns Bishop is aware of.  Remove drugs/medications (over-the-counter, prescriptions, illicit drugs), all items previously/currently identified as a safety concern.  The family member/significant other verbalizes understanding of the suicide prevention education information provided.  The family member/significant other agrees to remove the items of safety concern listed above.  Cathe Mons said he speaks to pt almost every day and will remain supportive after her discharge.  Pt having a lot of pain issues.  Joanne Chars, LCSW 07/21/2018, 4:07 PM

## 2018-07-21 NOTE — Plan of Care (Signed)
  Problem: Education: Goal: Knowledge of [facility] Education information/materials will improve Outcome: Progressing Goal: Emotional status will improve Outcome: Progressing Goal: Mental status will improve Outcome: Progressing   Problem: Activity: Goal: Interest or engagement in activities will improve Outcome: Progressing Goal: Sleeping patterns will improve Outcome: Progressing

## 2018-07-21 NOTE — Progress Notes (Signed)
D: Patient is visible in the milieu.  She is interacting well with staff and her peers.  Patient hopes to be a possible discharge tomorrow.  She rates her depression as a 6; hopelessness and anxiety as a 7.  She denies any thoughts of self harm today.  Her goal is to go home and follow up with her appointments.  Her affect is bright at times; she is smiling and talkative.    A: Continue to monitor medication management and MD orders.  Safety checks continued every 15 minutes per protocol.  Offer support and encouragement as needed.  R: Patient is receptive to staff; her behavior is appropriate.
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
## 2018-07-22 MED ORDER — GABAPENTIN 100 MG PO CAPS
200.0000 mg | ORAL_CAPSULE | Freq: Three times a day (TID) | ORAL | Status: DC
  Administered 2018-07-22 – 2018-07-25 (×9): 200 mg via ORAL
  Filled 2018-07-22 (×14): qty 2

## 2018-07-22 NOTE — B[facility] Group Notes (Signed)
[place] LCSW Group Therapy Note  Date/Time: 07/22/18, 1315  Type of Therapy/Topic:  Group Therapy:  Feelings about Diagnosis  Participation Level:  Active   Mood:pleasant   Description of Group:    This group will allow patients to explore their thoughts and feelings about diagnoses they have received. Patients will be guided to explore their level of understanding and acceptance of these diagnoses. Facilitator will encourage patients to process their thoughts and feelings about the reactions of others to their diagnosis, and will guide patients in identifying ways to discuss their diagnosis with significant others in their lives. This group will be process-oriented, with patients participating in exploration of their own experiences as well as giving and receiving support and challenge from other group members.   Therapeutic Goals: 1. Patient will demonstrate understanding of diagnosis as evidence by identifying two or more symptoms of the disorder:  2. Patient will be able to express two feelings regarding the diagnosis 3. Patient will demonstrate ability to communicate their needs through discussion and/or role plays  Summary of Patient Progress:Pt made several comments during group discussion regarding symptoms of bipolar disorder, mental health stigma, and feelings about diagnosis.          Therapeutic Modalities:   Cognitive Behavioral Therapy Brief Therapy Feelings Identification   Lurline Idol, LCSW

## 2018-07-22 NOTE — Progress Notes (Signed)
Adult Psychoeducational Group Note  Date:  07/22/2018 Time:  2:22 AM  Group Topic/Focus:  Wrap-Up Group:   The focus of this group is to help patients review their daily goal of treatment and discuss progress on daily workbooks.  Participation Level:  Active  Participation Quality:  Appropriate  Affect:  Appropriate  Cognitive:  Appropriate  Insight: Appropriate  Engagement in Group:  Engaged  Modes of Intervention:  Discussion  Additional Comments:  Pt played kickball and had fun.  Pt continues to find support meetings and other out-patient treatment for once she is discharged.  Pt wants remain taking meds and compliant with her treatment plan goals.  Pt rated the day at 10/10.  Yvan Dority 07/22/2018, 2:22 AM

## 2018-07-22 NOTE — Progress Notes (Signed)
Adult Psychoeducational Group Note  Date:  07/22/2018 Time:  9:23 PM  Group Topic/Focus:  Wrap-Up Group:   The focus of this group is to help patients review their daily goal of treatment and discuss progress on daily workbooks.  Participation Level:  Active  Participation Quality:  Appropriate  Affect:  Appropriate  Cognitive:  Alert and Oriented  Insight: Appropriate  Engagement in Group:  Improving  Modes of Intervention:  Clarification, Exploration and Support  Additional Comments:  Pt verbalized that she had a crappy day at first. Pt verbalized that when she woke up this morning, she felt dizzy and had anxiety. Pt verbalized that she isn't a morning person. Pt rated her day a 3. Pt verbalized that she wasn't ready to go home.  Chanel Mckesson, Patrick North 07/22/2018, 9:23 PM

## 2018-07-22 NOTE — Progress Notes (Signed)
[facility] MD Progress Note  07/22/2018 12:45 PM Daisy Myers  MRN:  [id number] Subjective: Patient is seen and examined.  Patient is a 52 year old female with a past psychiatric history significant for major depression as well as generalized anxiety disorder.  She is seen in follow-up.  Previously it was felt that she was improving.  Today she is more tearful and anxious.  She stated she was starting to have more suicidal thoughts this morning.  During the examination today she is very tearful and frightened about going home.  She feels very lonely and is scared that because she is alone at home that said her symptoms worsen.  Additionally she stated she felt very dizzy today, and that was something new for her.  We reviewed her medications on admission, and it looks like the one that might be the most likely culprit would be either Neurontin or Risperdal.  We discussed working on her coping skills so that when she gets out of the hospital she will be able to deal with being alone in her apartment more readily.  She did admit to helplessness, hopelessness and worthlessness.      Objective: I have reviewed chart notes and have met with patient. 52 year old female, presented due to depression, anxiety, reporting having a "breakdown" during an appointment with her therapist, during which she endorsed suicidal thoughts of overdosing. She reports partially improved mood and presents with a fuller range of affect. Denies SI. Remains vaguely anxious. Identifies loneliness as a stressor, trigger for depression,and states that one of the reasons she is feeling better here is due to increased social interactions with peers and more support from others. States that in the past, when she had a pet dog, she felt a lot better, but that after moving to a new apartment she was not allowed to have pets. She expresses hope she can get a letter from MD stating that having a pet dog would be beneficial to her. We spoke about  options such as volunteering and also participating in peer based [place] groups .  Denies medication side effects. Denies suicidal ideations . More future oriented . Visible on unit, going to groups. Principal Problem: <principal problem not specified> Diagnosis:   Patient Active Problem List   Diagnosis Date Noted  . Major depressive disorder, recurrent episode, severe ([place]) [F33.2] 07/17/2018  . Major depressive disorder, recurrent episode ([place]) [F33.9] 07/17/2018   Total Time spent with patient: 20 minutes  Past Psychiatric History: See admission H&P  Past Medical History:  Past Medical History:  Diagnosis Date  . Anxiety   . Asthma   . Depression   . PTSD (post-traumatic stress disorder)     Past Surgical History:  Procedure Laterality Date  . BACK SURGERY    . BREAST REDUCTION SURGERY    . CESAREAN SECTION    . TUBAL LIGATION     Family History:  Family History  Adopted: Yes   Family Psychiatric  History: See admission H&P Social History:  Social History   Substance and Sexual Activity  Alcohol Use No     Social History   Substance and Sexual Activity  Drug Use No    Social History   Socioeconomic History  . Marital status: Single    Spouse name: Not on file  . Number of children: Not on file  . Years of education: Not on file  . Highest education level: Not on file  Occupational History  . Not on file  Social Needs  .  Financial resource strain: Not on file  . Food insecurity:    Worry: Not on file    Inability: Not on file  . Transportation needs:    Medical: Not on file    Non-medical: Not on file  Tobacco Use  . Smoking status: Never Smoker  . Smokeless tobacco: Never Used  Substance and Sexual Activity  . Alcohol use: No  . Drug use: No  . Sexual activity: Not on file  Lifestyle  . Physical activity:    Days per week: Not on file    Minutes per session: Not on file  . Stress: Not on file  Relationships  . Social connections:    Talks  on phone: Not on file    Gets together: Not on file    Attends religious service: Not on file    Active member of club or organization: Not on file    Attends meetings of clubs or organizations: Not on file    Relationship status: Not on file  Other Topics Concern  . Not on file  Social History Narrative  . Not on file   Additional Social History:                         Sleep: Good  Appetite:  Fair  Current Medications: Current Facility-Administered Medications  Medication Dose Route Frequency Provider Last Rate Last Dose  . acetaminophen (TYLENOL) tablet 650 mg  650 mg Oral Q6H PRN Lindell Spar I, NP   650 mg at 07/21/18 1608  . albuterol (PROVENTIL HFA;VENTOLIN HFA) 108 (90 Base) MCG/ACT inhaler 1-2 puff  1-2 puff Inhalation Q4H PRN Cobos, Myer Peer, MD   2 puff at 07/21/18 2002  . alum & mag hydroxide-simeth (MAALOX/MYLANTA) 200-200-20 MG/5ML suspension 30 mL  30 mL Oral Q4H PRN Lindell Spar I, NP      . DULoxetine (CYMBALTA) DR capsule 40 mg  40 mg Oral Daily Cobos, Myer Peer, MD   40 mg at 07/22/18 0803  . gabapentin (NEURONTIN) capsule 300 mg  300 mg Oral TID Cobos, Myer Peer, MD   300 mg at 07/22/18 1202  . hydrOXYzine (ATARAX/VISTARIL) tablet 25 mg  25 mg Oral Q6H PRN Lindell Spar I, NP   25 mg at 07/19/18 0824  . magnesium hydroxide (MILK OF MAGNESIA) suspension 30 mL  30 mL Oral Daily PRN Nwoko, Agnes I, NP      . risperiDONE (RISPERDAL M-TABS) disintegrating tablet 1 mg  1 mg Oral QHS Cobos, Myer Peer, MD   1 mg at 07/21/18 2232  . traZODone (DESYREL) tablet 100 mg  100 mg Oral QHS PRN Cobos, Myer Peer, MD   100 mg at 07/21/18 2232    Lab Results: No results found for this or any previous visit (from the past 35 hour(s)).  Blood Alcohol level:  Lab Results  Component Value Date   ETH <10 89/78/4784    Metabolic Disorder Labs: Lab Results  Component Value Date   HGBA1C 6.0 (H) 07/19/2018   MPG 125.5 07/19/2018   Lab Results  Component Value  Date   PROLACTIN 26.1 (H) 07/18/2018   Lab Results  Component Value Date   CHOL 206 (H) 07/19/2018   TRIG 82 07/19/2018   HDL 57 07/19/2018   CHOLHDL 3.6 07/19/2018   VLDL 16 07/19/2018   LDLCALC 133 (H) 07/19/2018    Physical Findings: AIMS: Facial and Oral Movements Muscles of Facial Expression: None, normal Lips and Perioral Area:  None, normal Jaw: None, normal Tongue: None, normal,Extremity Movements Upper (arms, wrists, hands, fingers): None, normal Lower (legs, knees, ankles, toes): None, normal, Trunk Movements Neck, shoulders, hips: None, normal, Overall Severity Severity of abnormal movements (highest score from questions above): None, normal Incapacitation due to abnormal movements: None, normal Patient's awareness of abnormal movements (rate only patient's report): No Awareness, Dental Status Current problems with teeth and/or dentures?: No Does patient usually wear dentures?: No  CIWA:    COWS:     Musculoskeletal: Strength & Muscle Tone: within normal limits Gait & Station: normal Patient leans: N/A  Psychiatric Specialty Exam: Physical Exam  ROS  Blood pressure 140/67, pulse 72, temperature 98.1 F (36.7 C), temperature source Oral, resp. rate 16, height '4\' 11"'  (1.499 m), weight 71.2 kg, last menstrual period 07/14/2018, SpO2 99 %.Body mass index is 31.71 kg/m.  General Appearance: Casual  Eye Contact:  Poor  Speech:  Normal Rate  Volume:  Normal  Mood:  Depressed  Affect:  Tearful  Thought Process:  Coherent  Orientation:  Full (Time, Place, and Person)  Thought Content:  Logical  Suicidal Thoughts:  Yes.  without intent/plan  Homicidal Thoughts:  No  Memory:  Immediate;   Fair Recent;   Fair Remote;   Fair  Judgement:  Impaired  Insight:  Fair  Psychomotor Activity:  Increased  Concentration:  Concentration: Fair and Attention Span: Fair  Recall:  [employer] of Knowledge:  Fair  Language:  Fair  Akathisia:  Negative  Handed:  Right  AIMS  (if indicated):     Assets:  Desire for Improvement Housing Physical Health Resilience  ADL's:  Intact  Cognition:  WNL  Sleep:  Number of Hours: 6.75     Treatment Plan Summary: Daily contact with patient to assess and evaluate symptoms and progress in treatment, Medication management and Plan Patient is seen and examined.  Patient is a 52 year old female with the above-stated past psychiatric history seen in follow-up.  She stated she feels much worse today.  She is tearful, sad, helpless and hopeless.  She stated she was having suicidal thoughts again.  Most of this is secondary to her fears about being discharged to an empty home.  We discussed this and I encouraged her to work on her coping skills in groups.  I told her that the apartment would be empty at home whether she was released today or 6 months from now.  The objective was to cope better with being alone.  She did complain of some dizziness, and the only medicine that it looks like it might be affiliated with that would be either the risk for doll or the Neurontin.  I am going to reduce her Neurontin to 200 mg p.o. 3 times daily.  Her duloxetine was increased to 40 mg a day today, but given her depression I am going to continue that.  She also felt as though the Risperdal was helping her sleep, so I am going to hold on that for now.  I did review her blood pressures, and her blood pressure was elevated today, but the rest of the hospitalization was in the normal range.  Sharma Covert, MD 07/22/2018, 12:45 PM

## 2018-07-22 NOTE — Progress Notes (Signed)
Pt presents with an animated affect and an anxious mood. Pt expressed having suicidal thoughts this morning during shift assessment. Pt denies having a plan or intent. Pt expressed having suicidal thoughts today, because she not ready to discharge home, because she feels lonely at home. Pt reported feeling dizzy this morning. Pt's b/p assessed and stable. Pt reported that for the last couple of days, she's been waking up feeling dizzy. Writer informed MD in tx team of pt's complaint.  Medications reviewed with pt. Verbal support provided. Pt encouraged to attend groups. Pt encouraged to complete suicide safety plan. 15 minute checks performed for safety.  Pt compliant with tx plan.

## 2018-07-23 MED ORDER — RISPERIDONE 1 MG PO TABS
1.0000 mg | ORAL_TABLET | Freq: Every day | ORAL | Status: DC
  Administered 2018-07-23 – 2018-07-24 (×2): 1 mg via ORAL
  Filled 2018-07-23 (×5): qty 1

## 2018-07-23 NOTE — Plan of Care (Signed)
  Problem: Coping: Goal: Ability to verbalize frustrations and anger appropriately will improve Outcome: Progressing-She was able to seek out staff when she was upset this evening.   Problem: Activity: Goal: Interest or engagement in leisure activities will improve Outcome: Progressing-She has been attending groups and going outside/gym during therapeutic recreation times.

## 2018-07-23 NOTE — Therapy (Signed)
Occupational Therapy Group Note  Date:  07/23/2018 Time:  3:53 PM  Group Topic/Focus:  Stress Management  Participation Level:  Active  Participation Quality:  Redirectable  Affect:  Flat  Cognitive:  Appropriate  Insight: Improving  Engagement in Group:  Engaged  Modes of Intervention:  Activity, Discussion and Education  Additional Comments:    S: "I nee to learn how to better talk about my feelings in a healthy way"  O: Education given on self care and its importance in supporting mental health. Self care assessment given as a self rating scale for pt to identify areas of strength and weakness. Discussion given in how to improve current practices.  ?  A: Pt presents to group with flat affect, engaged and participatory throughout. Pt identifies psychological areas to improve self care this date. Occasionally tangential to sway peer who is homeless to stay in certain area, redirectable. ?  P: Education given on self care, handouts given to facilitate carryover into community.   Zenovia Jarred, MSOT, OTR/L  [place] 07/23/2018, 3:53 PM

## 2018-07-23 NOTE — Plan of Care (Signed)
  Problem: Health Behavior/Discharge Planning: Goal: Compliance with treatment plan for underlying cause of condition will improve Outcome: Progressing   Problem: Safety: Goal: Periods of time without injury will increase Outcome: Progressing   

## 2018-07-23 NOTE — Progress Notes (Signed)
D:  Daisy Myers was up and visible on the unit.  She was pleasant.  Mood was labile.  She was somewhat tangential in her conversation.  She denied any suicidal ideation this evening.  She does report "spiritual visions and voices" but explains that they are faith based and not real voices.  "I am not crazy."  She did report that she is very anxious about going home because she will be alone and  is hoping for support dog.  She asked RN multiple questions about getting a support dog and how to go about this.  Print out about what and how a support dog works given to her for review.  She denied any pain or discomfort and appeared to be in no physical distress.  She is currently resting with her eyes closed.  She appears to be asleep. A:  1:1 with RN for support and encouragement.  Medications as ordered.  Q 15 minute checks maintained for safety.  Encouraged participation in group and unit activities.   R:  Daisy Myers remains safe on the unit.  We will continue to monitor the progress towards her goals.

## 2018-07-23 NOTE — Progress Notes (Signed)
Pt presents with a flat affect and anxious/sad mood. Pt expressed feeling down this morning and lacking happiness. Pt rated on her self inventory sheet: depression 3/10, anxiety 10/10 and hopelessness 4/10. Pt endorses passive SI with no plan or intent. Pt expressed that the thoughts are always there when she's feeling down. Pt stated, "dont' worry, I won't do anything to hurt myself". Pt expressed wanting to get back to doing the things she enjoys doing, such as painting and drawing. Pt continues to be focus on getting a support dog but is unsure of the process for obtaining a dog. Pt agreed to speak with CSW in regards to finding out information pertaining to getting a support dogs.  Orders reviewed with pt. Verbal support provided. Pt encouraged to attend groups. 15 minute checks performed for safety.   Pt compliant with tx plan.

## 2018-07-23 NOTE — Progress Notes (Signed)
Adult Psychoeducational Group Note  Date:  07/23/2018 Time:  10:10 PM  Group Topic/Focus:  Wrap-Up Group:   The focus of this group is to help patients review their daily goal of treatment and discuss progress on daily workbooks.  Participation Level:  Active  Participation Quality:  Appropriate  Affect:  Appropriate  Cognitive:  Alert and Oriented  Insight: Appropriate  Engagement in Group:  Developing/Improving  Modes of Intervention:  Clarification, Exploration and Support  Additional Comments:  Pt verbalized that her anxiety went up this morning and that people were being disrespectful.  Pt verbalized that after lunch and dinner that she was okay. Pt verbalized that something positive was that her son, daughter, and Lyndel Safe came to visit. Pt verbalized that she had good company today. Pt verbalized that she rated her day an 8.   Ashlynn Gunnels, Patrick North 07/23/2018, 10:10 PM

## 2018-07-23 NOTE — Tx Team (Signed)
Interdisciplinary Treatment and Diagnostic Plan Update  07/23/2018 Time of Session: 0830AM Daisy Myers MRN: [id number]  Principal Diagnosis: MDD, recurrent, severe  Secondary Diagnoses: Active Problems:   Major depressive disorder, recurrent episode ([place])   Current Medications:  Current Facility-Administered Medications  Medication Dose Route Frequency Provider Last Rate Last Dose  . acetaminophen (TYLENOL) tablet 650 mg  650 mg Oral Q6H PRN Lindell Spar I, NP   650 mg at 07/21/18 1608  . albuterol (PROVENTIL HFA;VENTOLIN HFA) 108 (90 Base) MCG/ACT inhaler 1-2 puff  1-2 puff Inhalation Q4H PRN Cobos, Myer Peer, MD   2 puff at 07/21/18 2002  . alum & mag hydroxide-simeth (MAALOX/MYLANTA) 200-200-20 MG/5ML suspension 30 mL  30 mL Oral Q4H PRN Lindell Spar I, NP      . DULoxetine (CYMBALTA) DR capsule 40 mg  40 mg Oral Daily Cobos, Myer Peer, MD   40 mg at 07/23/18 0802  . gabapentin (NEURONTIN) capsule 200 mg  200 mg Oral TID Sharma Covert, MD   200 mg at 07/23/18 0802  . hydrOXYzine (ATARAX/VISTARIL) tablet 25 mg  25 mg Oral Q6H PRN Lindell Spar I, NP   25 mg at 07/19/18 0824  . magnesium hydroxide (MILK OF MAGNESIA) suspension 30 mL  30 mL Oral Daily PRN Nwoko, Agnes I, NP      . risperiDONE (RISPERDAL M-TABS) disintegrating tablet 1 mg  1 mg Oral QHS Cobos, Myer Peer, MD   1 mg at 07/22/18 2208  . traZODone (DESYREL) tablet 100 mg  100 mg Oral QHS PRN Cobos, Myer Peer, MD   100 mg at 07/22/18 2208   PTA Medications: Facility-Administered Medications Prior to Admission  Medication Dose Route Frequency Provider Last Rate Last Dose  . 0.9 %  sodium chloride infusion  500 mL Intravenous Continuous Irene Shipper, MD       No medications prior to admission.    Patient Stressors: Marital or family conflict  Patient Strengths: Ability for Engineer, manufacturing fund of knowledge  Treatment Modalities: Medication Management, Group therapy, Case management,  1  to 1 session with clinician, Psychoeducation, Recreational therapy.  Physician Treatment Plan for Primary Diagnosis:  MDD, recurrent, severe Long Term Goal(s): Improvement in symptoms so as ready for discharge Improvement in symptoms so as ready for discharge   Short Term Goals: Ability to identify changes in lifestyle to reduce recurrence of condition will improve Ability to identify and develop effective coping behaviors will improve Ability to identify changes in lifestyle to reduce recurrence of condition will improve Ability to verbalize feelings will improve Ability to disclose and discuss suicidal ideas Ability to demonstrate self-control will improve Ability to identify and develop effective coping behaviors will improve Ability to maintain clinical measurements within normal limits will improve  Medication Management: Evaluate patient's response, side effects, and tolerance of medication regimen.  Therapeutic Interventions: 1 to 1 sessions, Unit Group sessions and Medication administration.  Evaluation of Outcomes: Progressing  Physician Treatment Plan for Secondary Diagnosis: Active Problems:   Major depressive disorder, recurrent episode ([place])  Long Term Goal(s): Improvement in symptoms so as ready for discharge Improvement in symptoms so as ready for discharge   Short Term Goals: Ability to identify changes in lifestyle to reduce recurrence of condition will improve Ability to identify and develop effective coping behaviors will improve Ability to identify changes in lifestyle to reduce recurrence of condition will improve Ability to verbalize feelings will improve Ability to disclose and discuss suicidal ideas Ability to demonstrate  self-control will improve Ability to identify and develop effective coping behaviors will improve Ability to maintain clinical measurements within normal limits will improve     Medication Management: Evaluate patient's response, side  effects, and tolerance of medication regimen.  Therapeutic Interventions: 1 to 1 sessions, Unit Group sessions and Medication administration.  Evaluation of Outcomes: Progressing  RN Treatment Plan for Primary Diagnosis:  MDD, recurrent, severe Long Term Goal(s): Knowledge of disease and therapeutic regimen to maintain health will improve  Short Term Goals: Ability to identify and develop effective coping behaviors will improve and Compliance with prescribed medications will improve  Medication Management: RN will administer medications as ordered by provider, will assess and evaluate patient's response and provide education to patient for prescribed medication. RN will report any adverse and/or side effects to prescribing provider.  Therapeutic Interventions: 1 on 1 counseling sessions, Psychoeducation, Medication administration, Evaluate responses to treatment, Monitor vital signs and CBGs as ordered, Perform/monitor CIWA, COWS, AIMS and Fall Risk screenings as ordered, Perform wound care treatments as ordered.  Evaluation of Outcomes: Progressing  LCSW Treatment Plan for Primary Diagnosis: MDD, recurrent, severe Long Term Goal(s): Safe transition to appropriate next level of care at discharge, Engage patient in therapeutic group addressing interpersonal concerns.  Short Term Goals: Engage patient in aftercare planning with referrals and resources, Increase social support and Increase skills for wellness and recovery  Therapeutic Interventions: Assess for all discharge needs, 1 to 1 time with Social worker, Explore available resources and support systems, Assess for adequacy in community support network, Educate family and significant other(s) on suicide prevention, Complete Psychosocial Assessment, Interpersonal group therapy.  Evaluation of Outcomes: Progressing  Progress in Treatment: Attending groups: Yes. Participating in groups: Yes. Taking medication as prescribed:  Yes. Toleration medication: Yes. Family/Significant other contact made: SPE completed with pt's pastor  Patient understands diagnosis: Yes. Discussing patient identified problems/goals with staff: Yes. Medical problems stabilized or resolved: Yes. Denies suicidal/homicidal ideation: Yes. Issues/concerns per patient self-inventory: No. Other: none  New problem(s) identified: No, Describe:  none  New Short Term/Long Term Goal(s): medication management for mood stabilization; elimination of SI thoughts; development of comprehensive mental wellness/sobriety plan.   Patient Goals:  Not to harm myself.  Discharge Plan or Barriers: Pt plans to attend [facility] management and [facility] for therapy. [place] pamphlet, [facility] Crisis information provided to patient for additional community support and resources.   Reason for Continuation of Hospitalization: Depression Medication stabilization  Estimated Length of Stay: Friday, 07/25/18  Attendees: Patient:    Physician: Dr. Mallie Darting, MD 07/23/2018   Nursing: Chrys Racer RN 07/23/2018  RN Care Manager:x 07/23/2018  Social Worker: Janice Norrie LCSW 07/23/2018  Recreational Therapist: x 07/23/2018  Other: Lindell Spar NP; Cecille Rubin NP 07/23/2018  Other:    Other:    Scribe for Treatment Team: Avelina Laine, LCSW 07/23/2018 10:40 AM

## 2018-07-23 NOTE — Progress Notes (Signed)
[facility] MD Progress Note  07/23/2018 2:08 PM Daisy Myers  MRN:  [id number] Subjective: Patient is seen and examined.  Patient is a 52 year old female with a past psychiatric history significant for major depression as well as generalized anxiety disorder.  She is seen in follow-up.  Her mood and affect are improved today.  She is not tearful at all today, is able to smile and engage.  We discussed getting involved in an intensive outpatient program or some daily program when she is discharged from the hospital.  She felt like 1 of the groups this morning really helped her, and she felt like that group would be good to follow-up with as an outpatient.  She denied any side effects to her current medications.  She denied any suicidal ideation. Principal Problem: <principal problem not specified> Diagnosis:   Patient Active Problem List   Diagnosis Date Noted  . Major depressive disorder, recurrent episode, severe ([place]) [F33.2] 07/17/2018  . Major depressive disorder, recurrent episode ([place]) [F33.9] 07/17/2018   Total Time spent with patient: 15 minutes  Past Psychiatric History: See admission H&P  Past Medical History:  Past Medical History:  Diagnosis Date  . Anxiety   . Asthma   . Depression   . PTSD (post-traumatic stress disorder)     Past Surgical History:  Procedure Laterality Date  . BACK SURGERY    . BREAST REDUCTION SURGERY    . CESAREAN SECTION    . TUBAL LIGATION     Family History:  Family History  Adopted: Yes   Family Psychiatric  History: See admission H&P Social History:  Social History   Substance and Sexual Activity  Alcohol Use No     Social History   Substance and Sexual Activity  Drug Use No    Social History   Socioeconomic History  . Marital status: Single    Spouse name: Not on file  . Number of children: Not on file  . Years of education: Not on file  . Highest education level: Not on file  Occupational History  . Not on file  Social Needs   . Financial resource strain: Not on file  . Food insecurity:    Worry: Not on file    Inability: Not on file  . Transportation needs:    Medical: Not on file    Non-medical: Not on file  Tobacco Use  . Smoking status: Never Smoker  . Smokeless tobacco: Never Used  Substance and Sexual Activity  . Alcohol use: No  . Drug use: No  . Sexual activity: Not on file  Lifestyle  . Physical activity:    Days per week: Not on file    Minutes per session: Not on file  . Stress: Not on file  Relationships  . Social connections:    Talks on phone: Not on file    Gets together: Not on file    Attends religious service: Not on file    Active member of club or organization: Not on file    Attends meetings of clubs or organizations: Not on file    Relationship status: Not on file  Other Topics Concern  . Not on file  Social History Narrative  . Not on file   Additional Social History:                         Sleep: Fair  Appetite:  Fair  Current Medications: Current Facility-Administered Medications  Medication Dose Route  Frequency Provider Last Rate Last Dose  . acetaminophen (TYLENOL) tablet 650 mg  650 mg Oral Q6H PRN Lindell Spar I, NP   650 mg at 07/21/18 1608  . albuterol (PROVENTIL HFA;VENTOLIN HFA) 108 (90 Base) MCG/ACT inhaler 1-2 puff  1-2 puff Inhalation Q4H PRN Cobos, Myer Peer, MD   2 puff at 07/21/18 2002  . alum & mag hydroxide-simeth (MAALOX/MYLANTA) 200-200-20 MG/5ML suspension 30 mL  30 mL Oral Q4H PRN Lindell Spar I, NP      . DULoxetine (CYMBALTA) DR capsule 40 mg  40 mg Oral Daily Cobos, Myer Peer, MD   40 mg at 07/23/18 0802  . gabapentin (NEURONTIN) capsule 200 mg  200 mg Oral TID Sharma Covert, MD   200 mg at 07/23/18 1202  . hydrOXYzine (ATARAX/VISTARIL) tablet 25 mg  25 mg Oral Q6H PRN Lindell Spar I, NP   25 mg at 07/19/18 0824  . magnesium hydroxide (MILK OF MAGNESIA) suspension 30 mL  30 mL Oral Daily PRN Nwoko, Agnes I, NP      .  risperiDONE (RISPERDAL M-TABS) disintegrating tablet 1 mg  1 mg Oral QHS Cobos, Myer Peer, MD   1 mg at 07/22/18 2208  . traZODone (DESYREL) tablet 100 mg  100 mg Oral QHS PRN Cobos, Myer Peer, MD   100 mg at 07/22/18 2208    Lab Results: No results found for this or any previous visit (from the past 28 hour(s)).  Blood Alcohol level:  Lab Results  Component Value Date   ETH <10 40/34/7425    Metabolic Disorder Labs: Lab Results  Component Value Date   HGBA1C 6.0 (H) 07/19/2018   MPG 125.5 07/19/2018   Lab Results  Component Value Date   PROLACTIN 26.1 (H) 07/18/2018   Lab Results  Component Value Date   CHOL 206 (H) 07/19/2018   TRIG 82 07/19/2018   HDL 57 07/19/2018   CHOLHDL 3.6 07/19/2018   VLDL 16 07/19/2018   LDLCALC 133 (H) 07/19/2018    Physical Findings: AIMS: Facial and Oral Movements Muscles of Facial Expression: None, normal Lips and Perioral Area: None, normal Jaw: None, normal Tongue: None, normal,Extremity Movements Upper (arms, wrists, hands, fingers): None, normal Lower (legs, knees, ankles, toes): None, normal, Trunk Movements Neck, shoulders, hips: None, normal, Overall Severity Severity of abnormal movements (highest score from questions above): None, normal Incapacitation due to abnormal movements: None, normal Patient's awareness of abnormal movements (rate only patient's report): No Awareness, Dental Status Current problems with teeth and/or dentures?: No Does patient usually wear dentures?: No  CIWA:    COWS:     Musculoskeletal: Strength & Muscle Tone: within normal limits Gait & Station: normal Patient leans: N/A  Psychiatric Specialty Exam: Physical Exam  ROS  Blood pressure 116/85, pulse 80, temperature 98.1 F (36.7 C), temperature source Oral, resp. rate 16, height 4\' 11"  (1.499 m), weight 71.2 kg, last menstrual period 07/14/2018, SpO2 99 %.Body mass index is 31.71 kg/m.  General Appearance: Casual  Eye Contact:  Fair   Speech:  Normal Rate  Volume:  Normal  Mood:  Euthymic  Affect:  Appropriate  Thought Process:  Coherent  Orientation:  Full (Time, Place, and Person)  Thought Content:  Logical  Suicidal Thoughts:  Yes.  without intent/plan  Homicidal Thoughts:  No  Memory:  Immediate;   Fair Recent;   Fair Remote;   Fair  Judgement:  Intact  Insight:  Fair  Psychomotor Activity:  Normal  Concentration:  Concentration: Fair  and Attention Span: Fair  Recall:  [employer] of Knowledge:  Fair  Language:  Fair  Akathisia:  Negative  Handed:  Right  AIMS (if indicated):     Assets:  Communication Skills Desire for Improvement Housing Physical Health Resilience  ADL's:  Intact  Cognition:  WNL  Sleep:  Number of Hours: 6.75     Treatment Plan Summary: Daily contact with patient to assess and evaluate symptoms and progress in treatment, Medication management and Plan : Patient is seen and examined.  Patient is a 52 year old female with the above-stated past psychiatric history seen in follow-up.  She continues to slowly improve.  She is less tearful today.  Her suicidal ideation has decreased.  We discussed the possibility of discharge on Friday.  She is in agreement with that.  No changes in her medications today.  Sharma Covert, MD 07/23/2018, 2:08 PM

## 2018-07-23 NOTE — Progress Notes (Signed)
D    Pt is pleasant on approach and cooperative    She was happy that her medication was changed to a pill for swallowing instead of the disentegrating tablet    Her behavior is appropriate and she is compliant with treatment A   Verbal support given   Medications administered and effectiveness monitored    Q 15 min checks R    Pt is safe at this time

## 2018-07-24 NOTE — [facility] Group Notes (Signed)
[facility] LCSW Group Therapy Note  Date/Time: 07/24/18, 1315  Type of Therapy/Topic:  Group Therapy:  Balance in Life  Participation Level:  active  Description of Group:    This group will address the concept of balance and how it feels and looks when one is unbalanced. Patients will be encouraged to process areas in their lives that are out of balance, and identify reasons for remaining unbalanced. Facilitators will guide patients utilizing problem- solving interventions to address and correct the stressor making their life unbalanced. Understanding and applying boundaries will be explored and addressed for obtaining  and maintaining a balanced life. Patients will be encouraged to explore ways to assertively make their unbalanced needs known to significant others in their lives, using other group members and facilitator for support and feedback.  Therapeutic Goals: 1. Patient will identify two or more emotions or situations they have that consume much of in their lives. 2. Patient will identify signs/triggers that life has become out of balance:  3. Patient will identify two ways to set boundaries in order to achieve balance in their lives:  4. Patient will demonstrate ability to communicate their needs through discussion and/or role plays  Summary of Patient Progress:Pt shared that family, friends and financial are areas of her life that are out of balance.  Pt was attentive throughout group discussion and made several contributions.           Therapeutic Modalities:   Cognitive Behavioral Therapy Solution-Focused Therapy Assertiveness Training  Lurline Idol, [place]

## 2018-07-24 NOTE — Progress Notes (Signed)
[facility] MD Progress Note  07/24/2018 12:53 PM Daisy Myers  MRN:  [id number] Subjective: Patient is seen and examined.  Patient is a 52 year old female with a past psychiatric history significant for major depression as well as generalized anxiety disorder.  She is seen in follow-up.  She continues to slowly improve.  She is not tearful at all.  She is able to smile and engage.  She is interested in going to the day program after hospitalization.  She denied any suicidal ideation.  She denied any helplessness, hopelessness or worthlessness.  She denied any side effects of medication.  She did state that she has had blurry vision for a while, and this was prior to the hospitalization or medications.  She like a referral to an eye doctor. Principal Problem: <principal problem not specified> Diagnosis:   Patient Active Problem List   Diagnosis Date Noted  . Major depressive disorder, recurrent episode, severe ([place]) [F33.2] 07/17/2018  . Major depressive disorder, recurrent episode ([place]) [F33.9] 07/17/2018   Total Time spent with patient: 15 minutes  Past Psychiatric History: See admission H&P  Past Medical History:  Past Medical History:  Diagnosis Date  . Anxiety   . Asthma   . Depression   . PTSD (post-traumatic stress disorder)     Past Surgical History:  Procedure Laterality Date  . BACK SURGERY    . BREAST REDUCTION SURGERY    . CESAREAN SECTION    . TUBAL LIGATION     Family History:  Family History  Adopted: Yes   Family Psychiatric  History: See admission H&P Social History:  Social History   Substance and Sexual Activity  Alcohol Use No     Social History   Substance and Sexual Activity  Drug Use No    Social History   Socioeconomic History  . Marital status: Single    Spouse name: Not on file  . Number of children: Not on file  . Years of education: Not on file  . Highest education level: Not on file  Occupational History  . Not on file  Social Needs  .  Financial resource strain: Not on file  . Food insecurity:    Worry: Not on file    Inability: Not on file  . Transportation needs:    Medical: Not on file    Non-medical: Not on file  Tobacco Use  . Smoking status: Never Smoker  . Smokeless tobacco: Never Used  Substance and Sexual Activity  . Alcohol use: No  . Drug use: No  . Sexual activity: Not on file  Lifestyle  . Physical activity:    Days per week: Not on file    Minutes per session: Not on file  . Stress: Not on file  Relationships  . Social connections:    Talks on phone: Not on file    Gets together: Not on file    Attends religious service: Not on file    Active member of club or organization: Not on file    Attends meetings of clubs or organizations: Not on file    Relationship status: Not on file  Other Topics Concern  . Not on file  Social History Narrative  . Not on file   Additional Social History:                         Sleep: Good  Appetite:  Good  Current Medications: Current Facility-Administered Medications  Medication Dose Route Frequency  Provider Last Rate Last Dose  . acetaminophen (TYLENOL) tablet 650 mg  650 mg Oral Q6H PRN Lindell Spar I, NP   650 mg at 07/21/18 1608  . albuterol (PROVENTIL HFA;VENTOLIN HFA) 108 (90 Base) MCG/ACT inhaler 1-2 puff  1-2 puff Inhalation Q4H PRN Cobos, Myer Peer, MD   2 puff at 07/21/18 2002  . alum & mag hydroxide-simeth (MAALOX/MYLANTA) 200-200-20 MG/5ML suspension 30 mL  30 mL Oral Q4H PRN Lindell Spar I, NP      . DULoxetine (CYMBALTA) DR capsule 40 mg  40 mg Oral Daily Cobos, Myer Peer, MD   40 mg at 07/24/18 0749  . gabapentin (NEURONTIN) capsule 200 mg  200 mg Oral TID Sharma Covert, MD   200 mg at 07/24/18 1203  . hydrOXYzine (ATARAX/VISTARIL) tablet 25 mg  25 mg Oral Q6H PRN Lindell Spar I, NP   25 mg at 07/19/18 0824  . magnesium hydroxide (MILK OF MAGNESIA) suspension 30 mL  30 mL Oral Daily PRN Nwoko, Agnes I, NP      .  risperiDONE (RISPERDAL) tablet 1 mg  1 mg Oral QHS Sharma Covert, MD   1 mg at 07/23/18 2255  . traZODone (DESYREL) tablet 100 mg  100 mg Oral QHS PRN Cobos, Myer Peer, MD   100 mg at 07/23/18 2255    Lab Results: No results found for this or any previous visit (from the past 69 hour(s)).  Blood Alcohol level:  Lab Results  Component Value Date   ETH <10 14/78/2956    Metabolic Disorder Labs: Lab Results  Component Value Date   HGBA1C 6.0 (H) 07/19/2018   MPG 125.5 07/19/2018   Lab Results  Component Value Date   PROLACTIN 26.1 (H) 07/18/2018   Lab Results  Component Value Date   CHOL 206 (H) 07/19/2018   TRIG 82 07/19/2018   HDL 57 07/19/2018   CHOLHDL 3.6 07/19/2018   VLDL 16 07/19/2018   LDLCALC 133 (H) 07/19/2018    Physical Findings: AIMS: Facial and Oral Movements Muscles of Facial Expression: None, normal Lips and Perioral Area: None, normal Jaw: None, normal Tongue: None, normal,Extremity Movements Upper (arms, wrists, hands, fingers): None, normal Lower (legs, knees, ankles, toes): None, normal, Trunk Movements Neck, shoulders, hips: None, normal, Overall Severity Severity of abnormal movements (highest score from questions above): None, normal Incapacitation due to abnormal movements: None, normal Patient's awareness of abnormal movements (rate only patient's report): No Awareness, Dental Status Current problems with teeth and/or dentures?: No Does patient usually wear dentures?: No  CIWA:    COWS:     Musculoskeletal: Strength & Muscle Tone: within normal limits Gait & Station: normal Patient leans: N/A  Psychiatric Specialty Exam: Physical Exam  Nursing note and vitals reviewed. Constitutional: She is oriented to person, place, and time. She appears well-developed and well-nourished.  HENT:  Head: Normocephalic and atraumatic.  Respiratory: Effort normal.  Neurological: She is alert and oriented to person, place, and time.    ROS   Blood pressure 116/81, pulse 80, temperature 98.4 F (36.9 C), temperature source Oral, resp. rate 16, height 4\' 11"  (1.499 m), weight 71.2 kg, last menstrual period 07/14/2018, SpO2 99 %.Body mass index is 31.71 kg/m.  General Appearance: Casual  Eye Contact:  Good  Speech:  Normal Rate  Volume:  Normal  Mood:  Euthymic  Affect:  Congruent  Thought Process:  Coherent  Orientation:  Full (Time, Place, and Person)  Thought Content:  Logical  Suicidal Thoughts:  No  Homicidal Thoughts:  No  Memory:  Immediate;   Fair Recent;   Fair Remote;   Fair  Judgement:  Good  Insight:  Good  Psychomotor Activity:  Normal  Concentration:  Concentration: Fair and Attention Span: Fair  Recall:  [employer] of Knowledge:  Fair  Language:  Fair  Akathisia:  Negative  Handed:  Right  AIMS (if indicated):     Assets:  Communication Skills Desire for Improvement Housing Physical Health Resilience  ADL's:  Intact  Cognition:  WNL  Sleep:  Number of Hours: 5.25     Treatment Plan Summary: Daily contact with patient to assess and evaluate symptoms and progress in treatment, Medication management and Plan : Patient is seen and examined.  Patient is a 52 year old female with the above-stated past psychiatric history is seen in follow-up.  She is doing well.  No change in her Cymbalta, gabapentin, Risperdal or trazodone.  She denies any suicidal ideation.  We discussed possible discharge tomorrow, and that is what the plan will be.  No change in her medications.  She does request a referral to ophthalmology after discharge.  Sharma Covert, MD 07/24/2018, 12:53 PM

## 2018-07-24 NOTE — Progress Notes (Signed)
Adult Psychoeducational Group Note  Date:  07/24/2018 Time:  10:36 PM  Group Topic/Focus:  Wrap-Up Group:   The focus of this group is to help patients review their daily goal of treatment and discuss progress on daily workbooks.  Participation Level:  Active  Participation Quality:  Appropriate  Affect:  Appropriate  Cognitive:  Appropriate  Insight: Appropriate  Engagement in Group:  Engaged  Modes of Intervention:  Discussion  Additional Comments:  Pt stated she is ready to start her out-patient journey and utilize the tools learned here.  Pt rated the day at a 10/10.    Gumaro Brightbill 07/24/2018, 10:36 PM

## 2018-07-24 NOTE — Progress Notes (Signed)
Pt presents with a bright affect on approach. Pt expressed feeling better today, compared to yesterday. Pt expressed that the suicidal thoughts are always there but she doesn't intend on acting on those thoughts. Pt verbally contracts for safety. Pt rated on her self inventory sheet: depression 5/10, anxiety 6/10 and hopelessness 8/10. Pt reported fair sleep last night. Pt compliant with taking meds and denies any side effects.   Medications reviewed with pt. Verbal support provided. Pt encouraged to attend groups. 15 minute checks performed for safety.  Pt compliant with tx plan.

## 2018-07-24 NOTE — Progress Notes (Signed)
Nursing Progress Note: 7p-7a D: Pt currently presents with a anxious affect and behavior. Pt states "I need meds for my mood. I feel unstable. Like I need to freak out." Interacting appropriatrly with the milieu. Pt reports good sleep during the previous night with current medication regimen.   A: Pt provided with medications per providers orders. Pt's labs and vitals were monitored throughout the night. Pt supported emotionally and encouraged to express concerns and questions. Pt educated on medications.  R: Pt's safety ensured with 15 minute and environmental checks. Pt currently denies HI and AVH and endorses passive SI. Pt verbally contracts to seek staff if SI,HI, or AVH occurs and to consult with staff before acting on any harmful thoughts. Will continue to monitor.

## 2018-07-25 MED ORDER — GABAPENTIN 100 MG PO CAPS: 200.0000 mg | ORAL_CAPSULE | Freq: Three times a day (TID) | ORAL | 0 refills | Status: AC

## 2018-07-25 MED ORDER — DULOXETINE HCL 40 MG PO CPEP: 40.0000 mg | ORAL_CAPSULE | Freq: Every day | ORAL | 0 refills | Status: AC

## 2018-07-25 MED ORDER — ALBUTEROL SULFATE HFA 108 (90 BASE) MCG/ACT IN AERS: 1.0000 | INHALATION_SPRAY | RESPIRATORY_TRACT | 0 refills | Status: AC | PRN

## 2018-07-25 MED ORDER — RISPERIDONE 1 MG PO TABS: 1.0000 mg | ORAL_TABLET | Freq: Every day | ORAL | 0 refills | Status: AC

## 2018-07-25 MED ORDER — TRAZODONE HCL 100 MG PO TABS: 100.0000 mg | ORAL_TABLET | Freq: Every evening | ORAL | 0 refills | Status: AC | PRN

## 2018-07-25 MED ORDER — HYDROXYZINE HCL 25 MG PO TABS: 25.0000 mg | ORAL_TABLET | Freq: Four times a day (QID) | ORAL | 0 refills | Status: AC | PRN

## 2018-07-25 NOTE — BHH Suicide Risk Assessment (Signed)
[facility] Discharge Suicide Risk Assessment   Principal Problem: <principal problem not specified> Discharge Diagnoses:  Patient Active Problem List   Diagnosis Date Noted  . Major depressive disorder, recurrent episode, severe ([place]) [F33.2] 07/17/2018  . Major depressive disorder, recurrent episode ([place]) [F33.9] 07/17/2018    Total Time spent with patient: 15 minutes  Musculoskeletal: Strength & Muscle Tone: within normal limits Gait & Station: normal Patient leans: N/A  Psychiatric Specialty Exam: Review of Systems  All other systems reviewed and are negative.   Blood pressure 121/81, pulse 78, temperature 98.1 F (36.7 C), temperature source Oral, resp. rate 16, height 4\' 11"  (1.499 m), weight 71.2 kg, last menstrual period 07/14/2018, SpO2 99 %.Body mass index is 31.71 kg/m.  General Appearance: Casual  Eye Contact::  Good  Speech:  Normal Rate409  Volume:  Normal  Mood:  Euthymic  Affect:  Congruent  Thought Process:  Coherent  Orientation:  Full (Time, Place, and Person)  Thought Content:  Logical  Suicidal Thoughts:  No  Homicidal Thoughts:  No  Memory:  Immediate;   Fair Recent;   Fair Remote;   Fair  Judgement:  Intact  Insight:  Fair  Psychomotor Activity:  Normal  Concentration:  Good  Recall:  Good  Fund of Knowledge:Good  Language: Good  Akathisia:  Negative  Handed:  Right  AIMS (if indicated):     Assets:  Communication Skills Desire for Improvement Housing Physical Health  Sleep:  Number of Hours: 6.75  Cognition: WNL  ADL's:  Intact   Mental Status Per Nursing Assessment::   On Admission:  Suicidal ideation indicated by patient  Demographic Factors:  Low socioeconomic status and Living alone  Loss Factors: NA  Historical Factors: Impulsivity  Risk Reduction Factors:   Positive coping skills or problem solving skills  Continued Clinical Symptoms:  Depression:   Impulsivity  Cognitive Features That Contribute To Risk:  None     Suicide Risk:  Minimal: No identifiable suicidal ideation.  Patients presenting with no risk factors but with morbid ruminations; may be classified as minimal risk based on the severity of the depressive symptoms  Follow-up Information    Consulting, [facility] Go on 07/30/2018.   Specialty:  Behavioral Health Why:  Please attend your therapy appt on Wednesday, 07/30/18, at 3:00pm with Casilda Carls.  Contact information: [place] [address] [phone]        [facility], [facility] Go on 07/29/2018.   Specialty:  Family Medicine Why:  Please attend your medication appt with Dr Sherlynn Stalls on Tuesday, 07/29/18, at 2:00pm. Contact information: [address] [place] [address] [phone]           Plan Of Care/Follow-up recommendations:  Activity:  ad lib  Sharma Covert, MD 07/25/2018, 7:36 AM
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
## 2018-07-25 NOTE — Progress Notes (Signed)
  [facility] Adult Case Management Discharge Plan :  Will you be returning to the same living situation after discharge:  Yes,  own home At discharge, do you have transportation home?: Yes,  friend Do you have the ability to pay for your medications: Yes,  medicaid  Release of information consent forms completed and in the chart;  Patient's signature needed at discharge.  Patient to Follow up at: Follow-up Information    Consulting, [facility] Go on 07/30/2018.   Specialty:  Behavioral Health Why:  Please attend your therapy appt on Wednesday, 07/30/18, at 3:00pm with Casilda Carls.  Contact information: [place] [address] [phone]        Care, [facility] Go on 07/29/2018.   Specialty:  Family Medicine Why:  Please attend your medication appt with Dr Sherlynn Stalls on Tuesday, 07/29/18, at 2:00pm. Contact information: [address] [place] [address] [phone]           Next level of care provider has access to [place] and Suicide Prevention discussed: Yes,  with pastor  Have you used any form of tobacco in the last 30 days? (Cigarettes, Smokeless Tobacco, Cigars, and/or Pipes): No  Has patient been referred to the Quitline?: N/A patient is not a smoker  Patient has been referred for addiction treatment: N/A  Joanne Chars, [place] 07/25/2018, 3:46 PM

## 2018-07-25 NOTE — Progress Notes (Signed)
Pt is preapred for dc as wirter completed dc teaching. Pt stated pos undertanding and willingness to comply. Pt given cc of dc instrucitons ( AVs, ssp AND transition record. Pt completed daily assessment and on this she wrote she is feeling " more positive " today and she is able to contract verbally with this writer to not hurt herslef. Pt escorted to bldg entrance and dc'd .

## 2019-01-25 ENCOUNTER — Other Ambulatory Visit: Payer: Self-pay

## 2019-01-25 ENCOUNTER — Emergency Department (HOSPITAL_COMMUNITY): Payer: Medicaid Other

## 2019-01-25 ENCOUNTER — Encounter (HOSPITAL_COMMUNITY): Payer: Self-pay | Admitting: Emergency Medicine

## 2019-01-25 ENCOUNTER — Emergency Department (HOSPITAL_COMMUNITY)
Admission: EM | Admit: 2019-01-25 | Discharge: 2019-01-25 | Disposition: A | Discharge status: Home / Self Care | Payer: Medicaid Other | Attending: Emergency Medicine | Admitting: Emergency Medicine

## 2019-01-25 DIAGNOSIS — R52 Pain, unspecified: Secondary | ICD-10-CM | POA: Diagnosis present

## 2019-01-25 DIAGNOSIS — J069 Acute upper respiratory infection, unspecified: Secondary | ICD-10-CM | POA: Insufficient documentation

## 2019-01-25 DIAGNOSIS — J45909 Unspecified asthma, uncomplicated: Secondary | ICD-10-CM | POA: Insufficient documentation

## 2019-01-25 DIAGNOSIS — Z79899 Other long term (current) drug therapy: Secondary | ICD-10-CM | POA: Diagnosis not present

## 2019-01-25 DIAGNOSIS — R519 Headache, unspecified: Secondary | ICD-10-CM

## 2019-01-25 DIAGNOSIS — R110 Nausea: Secondary | ICD-10-CM

## 2019-01-25 DIAGNOSIS — R51 Headache: Secondary | ICD-10-CM

## 2019-01-25 DIAGNOSIS — R0602 Shortness of breath: Secondary | ICD-10-CM

## 2019-01-25 LAB — CBC WITH DIFFERENTIAL/PLATELET
Abs Immature Granulocytes: 0 10*3/uL (ref 0.00–0.07)
Basophils Absolute: 0 10*3/uL (ref 0.0–0.1)
Basophils Relative: 1 %
Eosinophils Absolute: 0.1 10*3/uL (ref 0.0–0.5)
Eosinophils Relative: 2 %
HCT: 39.2 % (ref 36.0–46.0)
HEMOGLOBIN: 12.2 g/dL (ref 12.0–15.0)
IMMATURE GRANULOCYTES: 0 %
LYMPHS PCT: 46 %
Lymphs Abs: 1.9 10*3/uL (ref 0.7–4.0)
MCH: 25.5 pg — ABNORMAL LOW (ref 26.0–34.0)
MCHC: 31.1 g/dL (ref 30.0–36.0)
MCV: 82 fL (ref 80.0–100.0)
MONOS PCT: 7 %
Monocytes Absolute: 0.3 10*3/uL (ref 0.1–1.0)
NEUTROS ABS: 1.8 10*3/uL (ref 1.7–7.7)
NEUTROS PCT: 44 %
PLATELETS: 280 10*3/uL (ref 150–400)
RBC: 4.78 MIL/uL (ref 3.87–5.11)
RDW: 14.6 % (ref 11.5–15.5)
WBC: 4.1 10*3/uL (ref 4.0–10.5)
nRBC: 0 % (ref 0.0–0.2)

## 2019-01-25 LAB — I-STAT BETA HCG BLOOD, ED (MC, WL, AP ONLY)

## 2019-01-25 LAB — BASIC METABOLIC PANEL
Anion gap: 10 (ref 5–15)
BUN: 8 mg/dL (ref 6–20)
CHLORIDE: 104 mmol/L (ref 98–111)
CO2: 23 mmol/L (ref 22–32)
Calcium: 9.1 mg/dL (ref 8.9–10.3)
Creatinine, Ser: 0.67 mg/dL (ref 0.44–1.00)
GLUCOSE: 84 mg/dL (ref 70–99)
Potassium: 3.4 mmol/L — ABNORMAL LOW (ref 3.5–5.1)
Sodium: 137 mmol/L (ref 135–145)

## 2019-01-25 LAB — I-STAT TROPONIN, ED: TROPONIN I, POC: 0 ng/mL (ref 0.00–0.08)

## 2019-01-25 MED ORDER — KETOROLAC TROMETHAMINE 30 MG/ML IJ SOLN
30.0000 mg | Freq: Once | INTRAMUSCULAR | Status: AC
  Administered 2019-01-25: 30 mg via INTRAVENOUS
  Filled 2019-01-25: qty 1

## 2019-01-25 MED ORDER — DIPHENHYDRAMINE HCL 50 MG/ML IJ SOLN
25.0000 mg | Freq: Once | INTRAMUSCULAR | Status: AC
  Administered 2019-01-25: 25 mg via INTRAVENOUS
  Filled 2019-01-25: qty 1

## 2019-01-25 MED ORDER — IPRATROPIUM BROMIDE 0.02 % IN SOLN
0.5000 mg | Freq: Once | RESPIRATORY_TRACT | Status: AC
  Administered 2019-01-25: 0.5 mg via RESPIRATORY_TRACT
  Filled 2019-01-25: qty 2.5

## 2019-01-25 MED ORDER — ALBUTEROL SULFATE HFA 108 (90 BASE) MCG/ACT IN AERS: 1.0000 | INHALATION_SPRAY | RESPIRATORY_TRACT | 0 refills | Status: AC | PRN

## 2019-01-25 MED ORDER — METOCLOPRAMIDE HCL 10 MG PO TABS: 10.0000 mg | ORAL_TABLET | Freq: Four times a day (QID) | ORAL | 0 refills | Status: AC | PRN

## 2019-01-25 MED ORDER — METOCLOPRAMIDE HCL 5 MG/ML IJ SOLN
10.0000 mg | Freq: Once | INTRAMUSCULAR | Status: AC
  Administered 2019-01-25: 10 mg via INTRAVENOUS
  Filled 2019-01-25: qty 2

## 2019-01-25 MED ORDER — ALBUTEROL SULFATE (2.5 MG/3ML) 0.083% IN NEBU
5.0000 mg | INHALATION_SOLUTION | Freq: Once | RESPIRATORY_TRACT | Status: AC
  Administered 2019-01-25: 5 mg via RESPIRATORY_TRACT
  Filled 2019-01-25: qty 6

## 2019-01-25 MED ORDER — SODIUM CHLORIDE 0.9 % IV BOLUS (SEPSIS)
1000.0000 mL | Freq: Once | INTRAVENOUS | Status: AC
  Administered 2019-01-25: 1000 mL via INTRAVENOUS

## 2019-01-25 NOTE — Discharge Instructions (Addendum)
Continue to stay well-hydrated. Continue to alternate between Tylenol and Ibuprofen for pain or fever. Use Mucinex/Robitussin/etc for cough suppression/expectoration of mucus. Use over the counter flonase and the netipot to help with nasal congestion. May consider over-the-counter Benadryl or other antihistamine like Claritin/Zyrtec/etc to decrease secretions and for help with your symptoms. Use inhaler as directed, as needed for cough/chest congestion/wheezing/shortness of breath. Use reglan as directed as needed for nausea or headaches. Follow up with your primary care doctor in 5-7 days for recheck of ongoing symptoms. Return to emergency department for emergent changing or worsening of symptoms.

## 2019-01-25 NOTE — ED Provider Notes (Signed)
[facility] EMERGENCY DEPARTMENT Provider Note   CSN: [id number] Arrival date & time: 01/25/19  1625     History   Chief Complaint Chief Complaint  Patient presents with  . Headache    HPI Daisy Myers is a 53 y.o. female with a PMHx of asthma, depression, PTSD, and anxiety, who presents to the ED with complaints of body aches and headaches for the last 3 days.  Patient states that she has had headaches like this before when she has had issues with her sinuses or allergies.  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  She describes her headache as 10/10 constant aching nonradiating frontal/sinus pain, worse with activity, and with no treatments tried prior to arrival.  She also reports having nasal congestion, dry cough, nausea, and intermittent lightheadedness with standing.  She also mentions that she has had some shortness of breath for "a while", she states that she has a history of asthma, and at night sometimes she will wheeze.  She has had sick contacts recently.  She did not receive her flu shot this year.  She is a non-smoker.  She denies any fevers, chills, chest pain, leg swelling, abdominal pain, vomiting, diarrhea, constipation, dysuria, hematuria, numbness, tingling, focal weakness, vision changes, or any other complaints at this time.  The history is provided by the patient and medical records. No language interpreter was used.  Headache  Associated symptoms: congestion, cough, myalgias (body aches) and nausea   Associated symptoms: no abdominal pain, no diarrhea, no fever, no numbness, no vomiting and no weakness     Past Medical History:  Diagnosis Date  . Anxiety   . Asthma   . Depression   . PTSD (post-traumatic stress disorder)     Patient Active Problem List   Diagnosis Date Noted  . Major depressive disorder, recurrent episode, severe ([place]) 07/17/2018  . Major depressive disorder,  recurrent episode ([place]) 07/17/2018    Past Surgical History:  Procedure Laterality Date  . BACK SURGERY    . BREAST REDUCTION SURGERY    . CESAREAN SECTION    . TUBAL LIGATION       OB History   No obstetric history on file.      Home Medications    Prior to Admission medications   Medication Sig Start Date End Date Taking? Authorizing Provider  albuterol (PROVENTIL HFA;VENTOLIN HFA) 108 (90 Base) MCG/ACT inhaler Inhale 1-2 puffs into the lungs every 4 (four) hours as needed for wheezing or shortness of breath. 07/25/18   Starkes-Perry, Gayland Curry, FNP  DULoxetine 40 MG CPEP Take 40 mg by mouth daily. 07/26/18   Starkes-Perry, Gayland Curry, FNP  gabapentin (NEURONTIN) 100 MG capsule Take 2 capsules (200 mg total) by mouth 3 (three) times daily. 07/25/18   Starkes-Perry, Gayland Curry, FNP  hydrOXYzine (ATARAX/VISTARIL) 25 MG tablet Take 1 tablet (25 mg total) by mouth every 6 (six) hours as needed for anxiety. 07/25/18   Starkes-Perry, Gayland Curry, FNP  risperiDONE (RISPERDAL) 1 MG tablet Take 1 tablet (1 mg total) by mouth at bedtime. 07/25/18   Starkes-Perry, Gayland Curry, FNP  traZODone (DESYREL) 100 MG tablet Take 1 tablet (100 mg total) by mouth at bedtime as needed for sleep. 07/25/18   Suella Broad, FNP    Family History Family History  Adopted: Yes    Social History Social History   Tobacco Use  . Smoking status: Never Smoker  .  Smokeless tobacco: Never Used  Substance Use Topics  . Alcohol use: No  . Drug use: No     Allergies   Citrus and Milk-related compounds   Review of Systems Review of Systems  Constitutional: Negative for chills and fever.  HENT: Positive for congestion.   Eyes: Negative for visual disturbance.  Respiratory: Positive for cough, shortness of breath and wheezing.   Cardiovascular: Negative for chest pain and leg swelling.  Gastrointestinal: Positive for nausea. Negative for abdominal pain, constipation, diarrhea and vomiting.  Genitourinary:  Negative for dysuria and hematuria.  Musculoskeletal: Positive for myalgias (body aches). Negative for arthralgias.  Skin: Negative for color change.  Allergic/Immunologic: Negative for immunocompromised state.  Neurological: Positive for light-headedness (intermittent with standing) and headaches. Negative for weakness and numbness.  Psychiatric/Behavioral: Negative for confusion.   All other systems reviewed and are negative for acute change except as noted in the HPI.    Physical Exam Updated Vital Signs BP (!) 142/93 (BP Location: Right Arm)   Pulse 66   Temp 99.5 F (37.5 C) (Oral)   Resp 16   LMP 01/24/2019   SpO2 99%   Physical Exam Vitals signs and nursing note reviewed.  Constitutional:      General: She is not in acute distress.    Appearance: Normal appearance. She is well-developed. She is not toxic-appearing.     Comments: Afebrile, nontoxic, NAD  HENT:     Head: Normocephalic and atraumatic.     Nose: Congestion present.     Right Sinus: Maxillary sinus tenderness and frontal sinus tenderness present.     Left Sinus: Maxillary sinus tenderness and frontal sinus tenderness present.     Comments: Mild nasal congestion, diffuse sinus TTP Eyes:     General: Vision grossly intact.        Right eye: No discharge.        Left eye: No discharge.     Extraocular Movements: Extraocular movements intact.     Conjunctiva/sclera: Conjunctivae normal.     Pupils: Pupils are equal, round, and reactive to light.     Comments: PERRL, EOMI, no nystagmus  Neck:     Musculoskeletal: Normal range of motion and neck supple. Normal range of motion. No neck rigidity, spinous process tenderness or muscular tenderness.     Comments: FROM intact without spinous process TTP, no bony stepoffs or deformities, no paraspinous muscle TTP or muscle spasms. No rigidity or meningeal signs. No bruising or swelling.  Cardiovascular:     Rate and Rhythm: Normal rate and regular rhythm.     Pulses:  Normal pulses.     Heart sounds: Normal heart sounds, S1 normal and S2 normal. No murmur. No friction rub. No gallop.      Comments: RRR, nl s1/s2, no m/r/g, distal pulses intact, no pedal edema  Pulmonary:     Effort: Pulmonary effort is normal. No respiratory distress.     Breath sounds: Normal breath sounds. No decreased breath sounds, wheezing, rhonchi or rales.     Comments: CTAB in all lung fields, no w/r/r, no hypoxia or increased WOB, speaking in full sentences, SpO2 99% on RA  Abdominal:     General: Bowel sounds are normal. There is no distension.     Palpations: Abdomen is soft. Abdomen is not rigid.     Tenderness: There is no abdominal tenderness. There is no right CVA tenderness, left CVA tenderness, guarding or rebound. Negative signs include Murphy's sign and McBurney's sign.  Musculoskeletal:  Normal range of motion.     Comments: MAE x4 Strength and sensation grossly intact in all extremities Distal pulses intact Gait steady  Skin:    General: Skin is warm and dry.     Findings: No rash.  Neurological:     General: No focal deficit present.     Mental Status: She is alert and oriented to person, place, and time.     GCS: GCS eye subscore is 4. GCS verbal subscore is 5. GCS motor subscore is 6.     Cranial Nerves: Cranial nerves are intact. No cranial nerve deficit.     Sensory: Sensation is intact. No sensory deficit.     Motor: Motor function is intact.     Coordination: Coordination is intact. Coordination normal.     Gait: Gait normal.     Comments: CN 2-12 grossly intact A&O x4 GCS 15 Sensation and strength intact Gait nonataxic including with tandem walking Coordination WNL Neg pronator drift   Psychiatric:        Mood and Affect: Mood and affect normal.        Behavior: Behavior normal.      ED Treatments / Results  Labs (all labs ordered are listed, but only abnormal results are displayed) Labs Reviewed  BASIC METABOLIC PANEL - Abnormal; Notable  for the following components:      Result Value   Potassium 3.4 (*)    All other components within normal limits  CBC WITH DIFFERENTIAL/PLATELET - Abnormal; Notable for the following components:   MCH 25.5 (*)    All other components within normal limits  I-STAT BETA HCG BLOOD, ED ([facility], [facility], [facility] ONLY)  I-STAT TROPONIN, ED    EKG EKG Interpretation  Date/Time:  Sunday January 25 2019 18:53:35 EST Ventricular Rate:  53 PR Interval:    QRS Duration: 88 QT Interval:  449 QTC Calculation: 422 R Axis:   75 Text Interpretation:  Sinus rhythm Low voltage, precordial leads Borderline T abnormalities, diffuse leads Confirmed by Pickering, Nathan ([id number]) on 01/25/2019 7:33:46 PM   Radiology Dg Chest 2 View  Result Date: 01/25/2019 CLINICAL DATA:  Headache. EXAM: CHEST - 2 VIEW COMPARISON:  None. FINDINGS: The heart size and mediastinal contours are within normal limits. Both lungs are clear. The visualized skeletal structures are unremarkable. IMPRESSION: No active cardiopulmonary disease. Electronically Signed   By: David  Williams III M.D   On: 01/25/2019 18:28    Procedures Procedures (including critical care time)  Medications Ordered in ED Medications  metoCLOPramide (REGLAN) injection 10 mg (10 mg Intravenous Given 01/25/19 1817)    And  diphenhydrAMINE (BENADRYL) injection 25 mg (25 mg Intravenous Given 01/25/19 1817)    And  sodium chloride 0.9 % bolus 1,000 mL (1,000 mLs Intravenous New Bag/Given 01/25/19 1818)    And  ketorolac (TORADOL) 30 MG/ML injection 30 mg (30 mg Intravenous Given 01/25/19 1817)  albuterol (PROVENTIL) (2.5 MG/3ML) 0.083% nebulizer solution 5 mg (5 mg Nebulization Given 01/25/19 1817)  ipratropium (ATROVENT) nebulizer solution 0.5 mg (0.5 mg Nebulization Given 01/25/19 1817)     Initial Impression / Assessment and Plan / ED Course  I have reviewed the triage vital signs and the nursing notes.  Pertinent labs & imaging results that were available during my  care of the patient were reviewed by me and considered in my medical decision making (see chart for details).     52  y.o. female here with body aches, headache, nasal congestion, cough, SOB, wheeze, nausea,  and intermittent lightheadedness x3 days. States she's had headaches like this before when her allergies or sinuses are bothering her. On exam, no focal neuro deficits, mild nasal congestion, mild sinus tenderness, clear lung exam, no increased work of breathing, no tachycardia or hypoxia, afebrile and nontoxic, no meningismus.  Suspect allergic versus viral URI.  Will get labs, chest x-ray, EKG, give migraine cocktail as well as duo nebs, and reassess shortly.  7:41 PM CBC w/diff WNL. BMP essentially unremarkable. Trop WNL. BetaHCG neg. CXR unremarkable. EKG nonischemic. Pt feeling much better, headache and SOB resolved. Suspect allergic vs viral URI/sinus headache, doubt need for abx, advised OTC remedies for symptomatic relief, rx for inhaler and reglan given, and f/up with PCP in 1wk for recheck. I explained the diagnosis and have given explicit precautions to return to the ER including for any other new or worsening symptoms. The patient understands and accepts the medical plan as it's been dictated and I have answered their questions. Discharge instructions concerning home care and prescriptions have been given. The patient is STABLE and is discharged to home in good condition.    Final Clinical Impressions(s) / ED Diagnoses   Final diagnoses:  Sinus headache  Upper respiratory tract infection, unspecified type  SOB (shortness of breath)  Nausea    ED Discharge Orders         Ordered    metoCLOPramide (REGLAN) 10 MG tablet  Every 6 hours PRN     01/25/19 1940    albuterol (PROVENTIL HFA;VENTOLIN HFA) 108 (90 Base) MCG/ACT inhaler  Every 4 hours PRN     01/25/19 [address], [place], [place] 01/25/19 1941    Davonna Belling, MD 01/25/19 2324

## 2019-01-25 NOTE — ED Triage Notes (Signed)
Pt c/o headache with nausea and dizziness x 3 days. No neuro deficits noted.

## 2019-03-14 ENCOUNTER — Emergency Department (HOSPITAL_COMMUNITY)
Admission: EM | Admit: 2019-03-14 | Discharge: 2019-03-15 | Disposition: A | Discharge status: Home / Self Care | Payer: Medicaid Other | Attending: Emergency Medicine | Admitting: Emergency Medicine

## 2019-03-14 ENCOUNTER — Emergency Department (HOSPITAL_COMMUNITY): Payer: Medicaid Other

## 2019-03-14 ENCOUNTER — Other Ambulatory Visit: Payer: Self-pay

## 2019-03-14 ENCOUNTER — Encounter (HOSPITAL_COMMUNITY): Payer: Self-pay

## 2019-03-14 DIAGNOSIS — R0789 Other chest pain: Secondary | ICD-10-CM | POA: Diagnosis not present

## 2019-03-14 DIAGNOSIS — Z79899 Other long term (current) drug therapy: Secondary | ICD-10-CM | POA: Insufficient documentation

## 2019-03-14 DIAGNOSIS — J45909 Unspecified asthma, uncomplicated: Secondary | ICD-10-CM | POA: Insufficient documentation

## 2019-03-14 DIAGNOSIS — R079 Chest pain, unspecified: Secondary | ICD-10-CM | POA: Diagnosis present

## 2019-03-14 LAB — I-STAT BETA HCG BLOOD, ED (MC, WL, AP ONLY): I-stat hCG, quantitative: <5 m[IU]/mL (ref <5)

## 2019-03-14 LAB — CBC
HCT: 35.5 % — ABNORMAL LOW (ref 36.0–46.0)
Hemoglobin: 11.1 g/dL — ABNORMAL LOW (ref 12.0–15.0)
MCH: 25.7 pg — ABNORMAL LOW (ref 26.0–34.0)
MCHC: 31.3 g/dL (ref 30.0–36.0)
MCV: 82.2 fL (ref 80.0–100.0)
Platelets: 276 10*3/uL (ref 150–400)
RBC: 4.32 MIL/uL (ref 3.87–5.11)
RDW: 15.4 % (ref 11.5–15.5)
WBC: 4.3 10*3/uL (ref 4.0–10.5)
nRBC: 0 % (ref 0.0–0.2)

## 2019-03-14 MED ORDER — SODIUM CHLORIDE 0.9% FLUSH: 3.0000 mL | Freq: Once | INTRAVENOUS | Status: DC

## 2019-03-14 MED ORDER — KETOROLAC TROMETHAMINE 30 MG/ML IJ SOLN
30.0000 mg | Freq: Once | INTRAMUSCULAR | Status: AC
  Administered 2019-03-15: 30 mg via INTRAMUSCULAR
  Filled 2019-03-14: qty 1

## 2019-03-14 NOTE — ED Notes (Signed)
Pt returned to room from xray.

## 2019-03-14 NOTE — ED Triage Notes (Signed)
To triage via EMS from pts work.  C/o right sided anterior chest pain.  Pt thinks may be anxiety.

## 2019-03-14 NOTE — ED Triage Notes (Signed)
EMS BP 156/80   HR 60 Temp 97.7 98% SpO2 RA

## 2019-03-14 NOTE — ED Provider Notes (Signed)
[facility] EMERGENCY DEPARTMENT Provider Note   CSN: [id number] Arrival date & time: 03/14/19  2244    History   Chief Complaint Chief Complaint  Patient presents with  . Chest Pain    HPI Reggie Welge is a 53 y.o. female.     HPI  This is a 53 year old female who presents with chest pain.  Patient reports onset of chest pain while starting her shift at work.  She works as a Animal nutritionist.  She reports right-sided chest tightness that is nonradiating.  She has had this pain before during asthma attacks and anxiety.  She does not report any significant anxiety at this time.  She is having ongoing discomfort.  Rates her pain at 5 out of 10.  Nothing seems to make the pain better or worse except "my job."  It is nonexertional.  She denies any recent illnesses, fevers, cough, shortness of breath.  She denies any lower extremity swelling, recent hospitalization, recent travel, or history of blood clots.  Denies any history of coronary artery disease, diabetes, hypertension.  Does report that "I may have high cholesterol."  Past Medical History:  Diagnosis Date  . Anxiety   . Asthma   . Depression   . PTSD (post-traumatic stress disorder)     Patient Active Problem List   Diagnosis Date Noted  . Major depressive disorder, recurrent episode, severe ([place]) 07/17/2018  . Major depressive disorder, recurrent episode ([place]) 07/17/2018    Past Surgical History:  Procedure Laterality Date  . BACK SURGERY    . BREAST REDUCTION SURGERY    . CESAREAN SECTION    . TUBAL LIGATION       OB History   No obstetric history on file.      Home Medications    Prior to Admission medications   Medication Sig Start Date End Date Taking? Authorizing Provider  albuterol (PROVENTIL HFA;VENTOLIN HFA) 108 (90 Base) MCG/ACT inhaler Inhale 1-2 puffs into the lungs every 4 (four) hours as needed for wheezing or shortness of breath. 07/25/18   Starkes-Perry, Gayland Curry, FNP   albuterol (PROVENTIL HFA;VENTOLIN HFA) 108 (90 Base) MCG/ACT inhaler Inhale 1-2 puffs into the lungs every 4 (four) hours as needed for wheezing or shortness of breath (or cough). 01/25/19   Street, [place], PA-C  DULoxetine 40 MG CPEP Take 40 mg by mouth daily. 07/26/18   Starkes-Perry, Gayland Curry, FNP  gabapentin (NEURONTIN) 100 MG capsule Take 2 capsules (200 mg total) by mouth 3 (three) times daily. 07/25/18   Starkes-Perry, Gayland Curry, FNP  hydrOXYzine (ATARAX/VISTARIL) 25 MG tablet Take 1 tablet (25 mg total) by mouth every 6 (six) hours as needed for anxiety. 07/25/18   Starkes-Perry, Gayland Curry, FNP  ibuprofen (ADVIL,MOTRIN) 600 MG tablet Take 1 tablet (600 mg total) by mouth every 6 (six) hours as needed. 03/15/19   , Barbette Hair, MD  metoCLOPramide (REGLAN) 10 MG tablet Take 1 tablet (10 mg total) by mouth every 6 (six) hours as needed for nausea (nausea/headache). 01/25/19   Street, [place], PA-C  risperiDONE (RISPERDAL) 1 MG tablet Take 1 tablet (1 mg total) by mouth at bedtime. 07/25/18   Starkes-Perry, Gayland Curry, FNP  traZODone (DESYREL) 100 MG tablet Take 1 tablet (100 mg total) by mouth at bedtime as needed for sleep. 07/25/18   Suella Broad, FNP    Family History Family History  Adopted: Yes    Social History Social History   Tobacco Use  . Smoking status: Never  Smoker  . Smokeless tobacco: Never Used  Substance Use Topics  . Alcohol use: No  . Drug use: No     Allergies   Citrus and Milk-related compounds   Review of Systems Review of Systems  Constitutional: Negative for fever.  Respiratory: Negative for shortness of breath.   Cardiovascular: Positive for chest pain. Negative for leg swelling.  Gastrointestinal: Negative for abdominal pain, nausea and vomiting.  Genitourinary: Negative for dysuria.  Neurological: Negative for weakness and numbness.  Psychiatric/Behavioral: The patient is not nervous/anxious.   All other systems reviewed and are negative.     Physical Exam Updated Vital Signs BP 111/69   Pulse (!) 56   Temp 98.4 F (36.9 C) (Oral)   Resp 20   Ht 1.524 m (5')   Wt 77.1 kg   LMP 02/11/2019   SpO2 100%   BMI 33.20 kg/m   Physical Exam Vitals signs and nursing note reviewed.  Constitutional:      General: She is not in acute distress.    Appearance: She is well-developed. She is not ill-appearing.  HENT:     Head: Normocephalic and atraumatic.  Eyes:     Pupils: Pupils are equal, round, and reactive to light.  Neck:     Musculoskeletal: Normal range of motion and neck supple.  Cardiovascular:     Rate and Rhythm: Normal rate and regular rhythm.     Heart sounds: Normal heart sounds.     Comments: Jaquelyn Bitter palpation over the right chest wall, no overlying skin changes or crepitus Pulmonary:     Effort: Pulmonary effort is normal. No respiratory distress.     Breath sounds: Normal breath sounds. No wheezing.  Abdominal:     General: Bowel sounds are normal.     Palpations: Abdomen is soft.  Musculoskeletal:     Right lower leg: She exhibits no tenderness. No edema.     Left lower leg: She exhibits no tenderness. No edema.  Skin:    General: Skin is warm and dry.  Neurological:     Mental Status: She is alert and oriented to person, place, and time.      ED Treatments / Results  Labs (all labs ordered are listed, but only abnormal results are displayed) Labs Reviewed  BASIC METABOLIC PANEL - Abnormal; Notable for the following components:      Result Value   Potassium 3.3 (*)    CO2 21 (*)    All other components within normal limits  CBC - Abnormal; Notable for the following components:   Hemoglobin 11.1 (*)    HCT 35.5 (*)    MCH 25.7 (*)    All other components within normal limits  TROPONIN I  I-STAT BETA HCG BLOOD, ED ([facility], [facility], [facility] ONLY)    EKG EKG Interpretation  Date/Time:  Saturday March 14 2019 21:55:38 EDT Ventricular Rate:  62 PR Interval:  160 QRS Duration: 76 QT Interval:  438 QTC  Calculation: 444 R Axis:   82 Text Interpretation:  Normal sinus rhythm Normal ECG Confirmed by Thayer Jew [phone]) on 03/14/2019 11:18:46 PM   Radiology Dg Chest 2 View  Result Date: 03/14/2019 CLINICAL DATA:  Chest pain EXAM: CHEST - 2 VIEW COMPARISON:  01/25/2019 FINDINGS: The heart size and mediastinal contours are within normal limits. Both lungs are clear. The visualized skeletal structures are unremarkable. IMPRESSION: No active cardiopulmonary disease. Electronically Signed   By: Ulyses Jarred M.D.   On: 03/14/2019 23:48  Procedures Procedures (including critical care time)  Medications Ordered in ED Medications  sodium chloride flush (NS) 0.9 % injection 3 mL (has no administration in time range)  ketorolac (TORADOL) 30 MG/ML injection 30 mg (30 mg Intramuscular Given 03/15/19 0044)     Initial Impression / Assessment and Plan / ED Course  I have reviewed the triage vital signs and the nursing notes.  Pertinent labs & imaging results that were available during my care of the patient were reviewed by me and considered in my medical decision making (see chart for details).        Patient presents with chest pain.  Fairly atypical in nature.  Reproducible on exam.  She is overall nontoxic and vital signs are reassuring.  EKG shows no evidence of acute ischemia.  Chest x-ray shows no evidence of pneumothorax or pneumonia.  Patient is low risk for ACS with a heart score of 2.  Troponin is negative.  Patient has no risk factors for PE and feel this is unlikely.  She improved with Toradol.  Suspect chest wall component.  We will have her follow-up with her primary physician.  After history, exam, and medical workup I feel the patient has been appropriately medically screened and is safe for discharge home. Pertinent diagnoses were discussed with the patient. Patient was given return precautions.   Final Clinical Impressions(s) / ED Diagnoses   Final diagnoses:  Atypical  chest pain    ED Discharge Orders         Ordered    ibuprofen (ADVIL,MOTRIN) 600 MG tablet  Every 6 hours PRN     03/15/19 0125           Merryl Hacker, MD 03/15/19 0129

## 2019-03-15 LAB — BASIC METABOLIC PANEL
Anion gap: 9 (ref 5–15)
BUN: 13 mg/dL (ref 6–20)
CO2: 21 mmol/L — ABNORMAL LOW (ref 22–32)
Calcium: 9.1 mg/dL (ref 8.9–10.3)
Chloride: 106 mmol/L (ref 98–111)
Creatinine, Ser: 0.74 mg/dL (ref 0.44–1.00)
GFR calc Af Amer: >60 mL/min (ref >60)
GFR calc non Af Amer: >60 mL/min (ref >60)
Glucose, Bld: 94 mg/dL (ref 70–99)
Potassium: 3.3 mmol/L — ABNORMAL LOW (ref 3.5–5.1)
Sodium: 136 mmol/L (ref 135–145)

## 2019-03-15 LAB — TROPONIN I: Troponin I: <0.03 ng/mL (ref <0.03)

## 2019-03-15 MED ORDER — IBUPROFEN 600 MG PO TABS: 600.0000 mg | ORAL_TABLET | Freq: Four times a day (QID) | ORAL | 0 refills | Status: AC | PRN

## 2019-03-15 NOTE — Discharge Instructions (Signed)
Your seen today for chest pain.  Your work-up was reassuring.  Your pain was reproducible on exam.  Take ibuprofen as needed.  Follow-up with your primary care physician.

## 2019-03-15 NOTE — ED Notes (Signed)
Discharge instructions discussed with pt. Pt verbalized understanding. Pt stable and ambulatory. No signature pad available. 

## 2019-06-12 ENCOUNTER — Other Ambulatory Visit: Payer: Self-pay

## 2019-06-12 ENCOUNTER — Emergency Department (HOSPITAL_BASED_OUTPATIENT_CLINIC_OR_DEPARTMENT_OTHER): Payer: Medicaid Other

## 2019-06-12 ENCOUNTER — Emergency Department (HOSPITAL_COMMUNITY): Payer: Medicaid Other

## 2019-06-12 ENCOUNTER — Encounter (HOSPITAL_COMMUNITY): Payer: Medicaid Other

## 2019-06-12 ENCOUNTER — Emergency Department (HOSPITAL_COMMUNITY)
Admission: EM | Admit: 2019-06-12 | Discharge: 2019-06-12 | Disposition: A | Discharge status: Home / Self Care | Payer: Medicaid Other | Attending: Emergency Medicine | Admitting: Emergency Medicine

## 2019-06-12 DIAGNOSIS — Z20828 Contact with and (suspected) exposure to other viral communicable diseases: Secondary | ICD-10-CM | POA: Insufficient documentation

## 2019-06-12 DIAGNOSIS — R52 Pain, unspecified: Secondary | ICD-10-CM

## 2019-06-12 DIAGNOSIS — R51 Headache: Secondary | ICD-10-CM | POA: Diagnosis not present

## 2019-06-12 DIAGNOSIS — Z8709 Personal history of other diseases of the respiratory system: Secondary | ICD-10-CM | POA: Diagnosis not present

## 2019-06-12 DIAGNOSIS — R0789 Other chest pain: Secondary | ICD-10-CM | POA: Diagnosis not present

## 2019-06-12 DIAGNOSIS — Z79899 Other long term (current) drug therapy: Secondary | ICD-10-CM | POA: Insufficient documentation

## 2019-06-12 DIAGNOSIS — R197 Diarrhea, unspecified: Secondary | ICD-10-CM | POA: Diagnosis not present

## 2019-06-12 DIAGNOSIS — M7989 Other specified soft tissue disorders: Secondary | ICD-10-CM

## 2019-06-12 DIAGNOSIS — R040 Epistaxis: Secondary | ICD-10-CM | POA: Insufficient documentation

## 2019-06-12 DIAGNOSIS — R0602 Shortness of breath: Secondary | ICD-10-CM | POA: Insufficient documentation

## 2019-06-12 DIAGNOSIS — M7918 Myalgia, other site: Secondary | ICD-10-CM | POA: Insufficient documentation

## 2019-06-12 DIAGNOSIS — M791 Myalgia, unspecified site: Secondary | ICD-10-CM

## 2019-06-12 LAB — BASIC METABOLIC PANEL
Anion gap: 8 (ref 5–15)
BUN: 10 mg/dL (ref 6–20)
CO2: 23 mmol/L (ref 22–32)
Calcium: 9.1 mg/dL (ref 8.9–10.3)
Chloride: 107 mmol/L (ref 98–111)
Creatinine, Ser: 0.72 mg/dL (ref 0.44–1.00)
GFR calc Af Amer: >60 mL/min (ref >60)
GFR calc non Af Amer: >60 mL/min (ref >60)
Glucose, Bld: 91 mg/dL (ref 70–99)
Potassium: 3.1 mmol/L — ABNORMAL LOW (ref 3.5–5.1)
Sodium: 138 mmol/L (ref 135–145)

## 2019-06-12 LAB — CBC
HCT: 39.4 % (ref 36.0–46.0)
Hemoglobin: 12.5 g/dL (ref 12.0–15.0)
MCH: 26.7 pg (ref 26.0–34.0)
MCHC: 31.7 g/dL (ref 30.0–36.0)
MCV: 84.2 fL (ref 80.0–100.0)
Platelets: 278 10*3/uL (ref 150–400)
RBC: 4.68 MIL/uL (ref 3.87–5.11)
RDW: 15.1 % (ref 11.5–15.5)
WBC: 5.4 10*3/uL (ref 4.0–10.5)
nRBC: 0 % (ref 0.0–0.2)

## 2019-06-12 LAB — TROPONIN I (HIGH SENSITIVITY): Troponin I (High Sensitivity): 4 ng/L (ref <18)

## 2019-06-12 MED ORDER — KETOROLAC TROMETHAMINE 60 MG/2ML IM SOLN
30.0000 mg | Freq: Once | INTRAMUSCULAR | Status: AC
  Administered 2019-06-12: 06:00:00 via INTRAMUSCULAR
  Filled 2019-06-12: qty 2

## 2019-06-12 NOTE — ED Provider Notes (Signed)
[facility] EMERGENCY DEPARTMENT Provider Note   CSN: [id number] Arrival date & time: 06/12/19  0510     History   Chief Complaint Chief Complaint  Patient presents with  . Multiple Complaints    HPI Daisy Myers is a 53 y.o. female.      Leg Pain Location:  Leg Time since incident:  2 weeks Injury: no   Leg location:  L lower leg Pain details:    Quality:  Shooting and sharp   Radiates to:  Does not radiate   Severity:  Severe   Duration:  4 weeks   Timing:  Constant   Progression:  Worsening Chronicity:  New Dislocation: no   Foreign body present:  No foreign bodies Prior injury to area:  No Relieved by:  None tried Associated symptoms: fever     Past Medical History:  Diagnosis Date  . Anxiety   . Asthma   . Depression   . PTSD (post-traumatic stress disorder)     Patient Active Problem List   Diagnosis Date Noted  . Major depressive disorder, recurrent episode, severe ([place]) 07/17/2018  . Major depressive disorder, recurrent episode ([place]) 07/17/2018    Past Surgical History:  Procedure Laterality Date  . BACK SURGERY    . BREAST REDUCTION SURGERY    . CESAREAN SECTION    . TUBAL LIGATION       OB History   No obstetric history on file.      Home Medications    Prior to Admission medications   Medication Sig Start Date End Date Taking? Authorizing Provider  albuterol (PROVENTIL HFA;VENTOLIN HFA) 108 (90 Base) MCG/ACT inhaler Inhale 1-2 puffs into the lungs every 4 (four) hours as needed for wheezing or shortness of breath. 07/25/18   Starkes-Perry, Gayland Curry, FNP  albuterol (PROVENTIL HFA;VENTOLIN HFA) 108 (90 Base) MCG/ACT inhaler Inhale 1-2 puffs into the lungs every 4 (four) hours as needed for wheezing or shortness of breath (or cough). 01/25/19   Street, [place], PA-C  DULoxetine 40 MG CPEP Take 40 mg by mouth daily. 07/26/18   Starkes-Perry, Gayland Curry, FNP  gabapentin (NEURONTIN) 100 MG capsule Take 2 capsules (200 mg  total) by mouth 3 (three) times daily. 07/25/18   Starkes-Perry, Gayland Curry, FNP  hydrOXYzine (ATARAX/VISTARIL) 25 MG tablet Take 1 tablet (25 mg total) by mouth every 6 (six) hours as needed for anxiety. 07/25/18   Starkes-Perry, Gayland Curry, FNP  ibuprofen (ADVIL,MOTRIN) 600 MG tablet Take 1 tablet (600 mg total) by mouth every 6 (six) hours as needed. 03/15/19   Horton, Barbette Hair, MD  metoCLOPramide (REGLAN) 10 MG tablet Take 1 tablet (10 mg total) by mouth every 6 (six) hours as needed for nausea (nausea/headache). 01/25/19   Street, [place], PA-C  risperiDONE (RISPERDAL) 1 MG tablet Take 1 tablet (1 mg total) by mouth at bedtime. 07/25/18   Starkes-Perry, Gayland Curry, FNP  traZODone (DESYREL) 100 MG tablet Take 1 tablet (100 mg total) by mouth at bedtime as needed for sleep. 07/25/18   Suella Broad, FNP    Family History Family History  Adopted: Yes    Social History Social History   Tobacco Use  . Smoking status: Never Smoker  . Smokeless tobacco: Never Used  Substance Use Topics  . Alcohol use: No  . Drug use: No     Allergies   Citrus and Milk-related compounds   Review of Systems Review of Systems  Constitutional: Positive for chills and fever.  HENT:  Positive for congestion, nosebleeds, rhinorrhea, sinus pressure and sinus pain.   Respiratory: Positive for cough and shortness of breath.   All other systems reviewed and are negative.    Physical Exam Updated Vital Signs BP (!) 164/92 (BP Location: Right Arm)   Pulse 79   Temp 98.5 F (36.9 C) (Oral)   Resp 16   Ht 5' (1.524 m)   Wt 75 kg   SpO2 99%   BMI 32.29 kg/m   Physical Exam Vitals signs and nursing note reviewed.  Constitutional:      Appearance: She is well-developed.  HENT:     Head: Normocephalic and atraumatic.     Nose: Congestion and rhinorrhea present.     Mouth/Throat:     Mouth: Mucous membranes are dry.     Pharynx: Oropharynx is clear.  Eyes:     Extraocular Movements: Extraocular  movements intact.     Conjunctiva/sclera: Conjunctivae normal.  Neck:     Musculoskeletal: Normal range of motion.  Cardiovascular:     Rate and Rhythm: Normal rate and regular rhythm.  Pulmonary:     Effort: No respiratory distress.     Breath sounds: No stridor.  Abdominal:     General: Bowel sounds are normal. There is no distension.  Musculoskeletal: Normal range of motion.        General: Tenderness (to left posterior calf) present.  Skin:    General: Skin is warm and dry.  Neurological:     General: No focal deficit present.     Mental Status: She is alert.      ED Treatments / Results  Labs (all labs ordered are listed, but only abnormal results are displayed) Labs Reviewed  BASIC METABOLIC PANEL - Abnormal; Notable for the following components:      Result Value   Potassium 3.1 (*)    All other components within normal limits  CBC  TROPONIN I (HIGH SENSITIVITY)  PREGNANCY, URINE    EKG None  Radiology Dg Chest 2 View  Result Date: 06/12/2019 CLINICAL DATA:  53 y/o  F; shortness of breath and chest pain. EXAM: CHEST - 2 VIEW COMPARISON:  03/14/2019 chest radiograph. FINDINGS: Stable heart size and mediastinal contours are within normal limits. Both lungs are clear. The visualized skeletal structures are unremarkable. IMPRESSION: No acute pulmonary process identified. Electronically Signed   By: Kristine Garbe M.D.   On: 06/12/2019 06:03    Procedures Procedures (including critical care time)  Medications Ordered in ED Medications - No data to display   Initial Impression / Assessment and Plan / ED Course  I have reviewed the triage vital signs and the nursing notes.  Pertinent labs & imaging results that were available during my care of the patient were reviewed by me and considered in my medical decision making (see chart for details).   eval for DVT. Test for covid but likely some other viral URI. Low susp for PE/ACS.   Care transferred  pending fu labs/dvt study.   Final Clinical Impressions(s) / ED Diagnoses   Final diagnoses:  None    ED Discharge Orders    None       Zykira Matlack, Corene Cornea, MD 06/13/19 [phone]

## 2019-06-12 NOTE — ED Notes (Signed)
Patient Alert and oriented to baseline. Stable and ambulatory to baseline. Patient verbalized understanding of the discharge instructions.  Patient belongings were taken by the patient.   

## 2019-06-12 NOTE — ED Triage Notes (Addendum)
C/o HA, intermittent nose bleeds, CP, SOB, diarrhea and body aches since Wednesday. NAD noted at this time. Pt also requested to be tested for Covid.

## 2019-06-12 NOTE — Progress Notes (Signed)
Left lower extremity venous duplex completed. Refer to "CV Proc" under chart review to view preliminary results.  06/12/2019 11:01 AM Maudry Mayhew, MHA, RVT, RDCS, RDMS

## 2019-06-12 NOTE — Discharge Instructions (Signed)
Your ultrasound was negative for a blood clot in the leg.  Please follow-up with your family doctor.

## 2019-06-13 LAB — NOVEL CORONAVIRUS, NAA (HOSP ORDER, SEND-OUT TO REF LAB; TAT 18-24 HRS): SARS-CoV-2, NAA: NOT DETECTED

## 2019-09-01 ENCOUNTER — Other Ambulatory Visit: Payer: Self-pay | Admitting: Internal Medicine

## 2019-09-01 DIAGNOSIS — Z1231 Encounter for screening mammogram for malignant neoplasm of breast: Secondary | ICD-10-CM

## 2019-09-22 ENCOUNTER — Ambulatory Visit (INDEPENDENT_AMBULATORY_CARE_PROVIDER_SITE_OTHER): Payer: Medicaid Other | Admitting: Orthopaedic Surgery

## 2019-09-22 ENCOUNTER — Encounter: Payer: Self-pay | Admitting: Orthopaedic Surgery

## 2019-09-22 ENCOUNTER — Ambulatory Visit: Payer: Self-pay

## 2019-09-22 ENCOUNTER — Other Ambulatory Visit: Payer: Self-pay

## 2019-09-22 ENCOUNTER — Ambulatory Visit (INDEPENDENT_AMBULATORY_CARE_PROVIDER_SITE_OTHER): Payer: Medicaid Other

## 2019-09-22 DIAGNOSIS — M25562 Pain in left knee: Secondary | ICD-10-CM | POA: Diagnosis not present

## 2019-09-22 DIAGNOSIS — G8929 Other chronic pain: Secondary | ICD-10-CM

## 2019-09-22 DIAGNOSIS — M25561 Pain in right knee: Secondary | ICD-10-CM | POA: Diagnosis not present

## 2019-09-22 NOTE — Progress Notes (Signed)
Office Visit Note   Patient: Daisy Myers           Date of Birth: [birth year]           MRN: [id number] Visit Date: 09/22/2019              Requested by: Pa, [place],  [place] [id number] PCP: Pa, [facility]   Assessment & Plan: Visit Diagnoses:  1. Chronic pain of right knee   2. Chronic pain of left knee     Plan: Per her request I do not mind trying hinged knee braces for both knees for just support and stability since she is on her feet all day long even though her knees are not ligamentously unstable.  I did offer her a steroid injection in both knees and she agreed with this and tolerated it well.  I explained the risk and benefits of steroid injections as well.  She will work on Forensic scientist exercises as well.  All question concerns were answered and addressed.  We will see her back in 4 weeks to see how she is doing with the knee braces and how she responded to the steroid injections.  Follow-Up Instructions: Return in about 4 weeks (around 10/20/2019).   Orders:  Orders Placed This Encounter  Procedures  . Large Joint Inj  . XR Knee 1-2 Views Right  . XR Knee 1-2 Views Left   No orders of the defined types were placed in this encounter.     Procedures: Large Joint Inj: bilateral knee on 09/22/2019 10:09 AM Indications: diagnostic evaluation and pain Details: 22 G 1.5 in needle, superolateral approach  Arthrogram: No  Outcome: tolerated well, no immediate complications Procedure, treatment alternatives, risks and benefits explained, specific risks discussed. Consent was given by the patient. Immediately prior to procedure a time out was called to verify the correct patient, procedure, equipment, support staff and site/side marked as required. Patient was prepped and draped in the usual sterile fashion.       Clinical Data: No additional findings.   Subjective: Chief Complaint   Patient presents with  . Right Knee - Pain  . Left Knee - Pain  The patient is a 53 year old female referred from Dr. Leanne Lovely to evaluate and treat bilateral knee pain and instability.  She says for 3 years now both knees have hurt her and they swell and they feel like they are going to pop out of place and give way.  She stands for long period of time at work.  She is never had surgery on either knee and has not had any injury that she is aware of.  She would like [place] to consider knee braces for her knees.  She denies any numbness and tingling in her feet today but she is on Neurontin.  She takes trazodone at night.  She is also on 600 mg of ibuprofen for pain and inflammation as needed.  She has never work on quad strengthening exercises and has not had any type of steroid injections in either knee.  She is not a diabetic.  HPI  Review of Systems She currently denies any headache, chest pain, shortness of breath, fever, chills, nausea, vomiting  Objective: Vital Signs: There were no vitals taken for this visit.  Physical Exam She is alert and orient x3 and in no acute distress Ortho  Exam Examination of both knees today shows no effusion.  Both patellas track well.  Her range of motion is full both knees.  Both knees feel ligamentously stable but both knees hurt along the medial joint line.  Her McMurray's exam is negative bilaterally Specialty Comments:  No specialty comments available.  Imaging: Xr Knee 1-2 Views Left  Result Date: 09/22/2019 An AP and lateral of the left knee shows no acute findings.  The joint space is well-maintained and the alignment is well-maintained.  Xr Knee 1-2 Views Right  Result Date: 09/22/2019 2 views of the right knee show no acute findings.  The joint space is well-maintained.    PMFS History: Patient Active Problem List   Diagnosis Date Noted  . Major depressive disorder, recurrent episode, severe ([place]) 07/17/2018  . Major depressive disorder,  recurrent episode ([place]) 07/17/2018   Past Medical History:  Diagnosis Date  . Anxiety   . Asthma   . Depression   . PTSD (post-traumatic stress disorder)     Family History  Adopted: Yes    Past Surgical History:  Procedure Laterality Date  . BACK SURGERY    . BREAST REDUCTION SURGERY    . CESAREAN SECTION    . TUBAL LIGATION     Social History   Occupational History  . Not on file  Tobacco Use  . Smoking status: Never Smoker  . Smokeless tobacco: Never Used  Substance and Sexual Activity  . Alcohol use: No  . Drug use: No  . Sexual activity: Not on file

## 2019-10-19 ENCOUNTER — Other Ambulatory Visit: Payer: Self-pay

## 2019-10-19 ENCOUNTER — Ambulatory Visit
Admission: RE | Admit: 2019-10-19 | Discharge: 2019-10-19 | Disposition: A | Discharge status: Home / Self Care | Payer: Medicaid Other | Source: Ambulatory Visit | Attending: Internal Medicine | Admitting: Internal Medicine

## 2019-10-19 DIAGNOSIS — Z1231 Encounter for screening mammogram for malignant neoplasm of breast: Secondary | ICD-10-CM

## 2019-10-20 ENCOUNTER — Ambulatory Visit (INDEPENDENT_AMBULATORY_CARE_PROVIDER_SITE_OTHER): Payer: Medicaid Other | Admitting: Orthopaedic Surgery

## 2019-10-20 ENCOUNTER — Other Ambulatory Visit: Payer: Self-pay

## 2019-10-20 ENCOUNTER — Encounter: Payer: Self-pay | Admitting: Orthopaedic Surgery

## 2019-10-20 DIAGNOSIS — M25562 Pain in left knee: Secondary | ICD-10-CM | POA: Diagnosis not present

## 2019-10-20 DIAGNOSIS — G8929 Other chronic pain: Secondary | ICD-10-CM

## 2019-10-20 DIAGNOSIS — M25561 Pain in right knee: Secondary | ICD-10-CM | POA: Diagnosis not present

## 2019-10-20 NOTE — Progress Notes (Signed)
The patient returns for follow-up about a month after injected both knees with a steroid injection and gave her hinged knee braces.  Her x-rays of her knees show no acute findings from her last visit and the joint space was well-maintained on each knee.  She still reports significant right knee pain.  She also complains of bilateral foot pain.  It is the bottom of her feet hurt on both sides and her signs and symptoms are consistent with plantar fasciitis.  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  She may end up needing custom orthotics or other treatment modalities as it relates to plantar fasciitis.  I have given her information for contacting the physicians at the [facility].  From a right and left knee standpoint there is no effusion.  Both knees hyperextend.  Both knees are ligamentously stable on exam but very painful to her.  At this point we will at least obtain an MRI of the right knee to rule out any type of internal derangement that is causing her the pain as well as instability symptoms.  We will see her back after the MRI.

## 2019-10-30 ENCOUNTER — Ambulatory Visit: Payer: Medicaid Other | Admitting: Podiatry

## 2019-11-11 ENCOUNTER — Ambulatory Visit: Payer: Medicaid Other | Admitting: Orthopaedic Surgery

## 2019-11-13 ENCOUNTER — Ambulatory Visit: Payer: Medicaid Other | Admitting: Podiatry

## 2019-11-23 ENCOUNTER — Other Ambulatory Visit: Payer: Medicaid Other

## 2020-12-14 ENCOUNTER — Other Ambulatory Visit: Payer: Self-pay | Admitting: Orthopaedic Surgery

## 2020-12-14 DIAGNOSIS — G8929 Other chronic pain: Secondary | ICD-10-CM

## 2021-04-27 IMAGING — MG DIGITAL SCREENING BILAT W/ CAD
4 series · 4 of 4 positions shown · non-contrast
Comparison: Previous exam(s).

CLINICAL DATA: Screening.

EXAM:
DIGITAL SCREENING BILATERAL MAMMOGRAM WITH CAD

[L MLO]
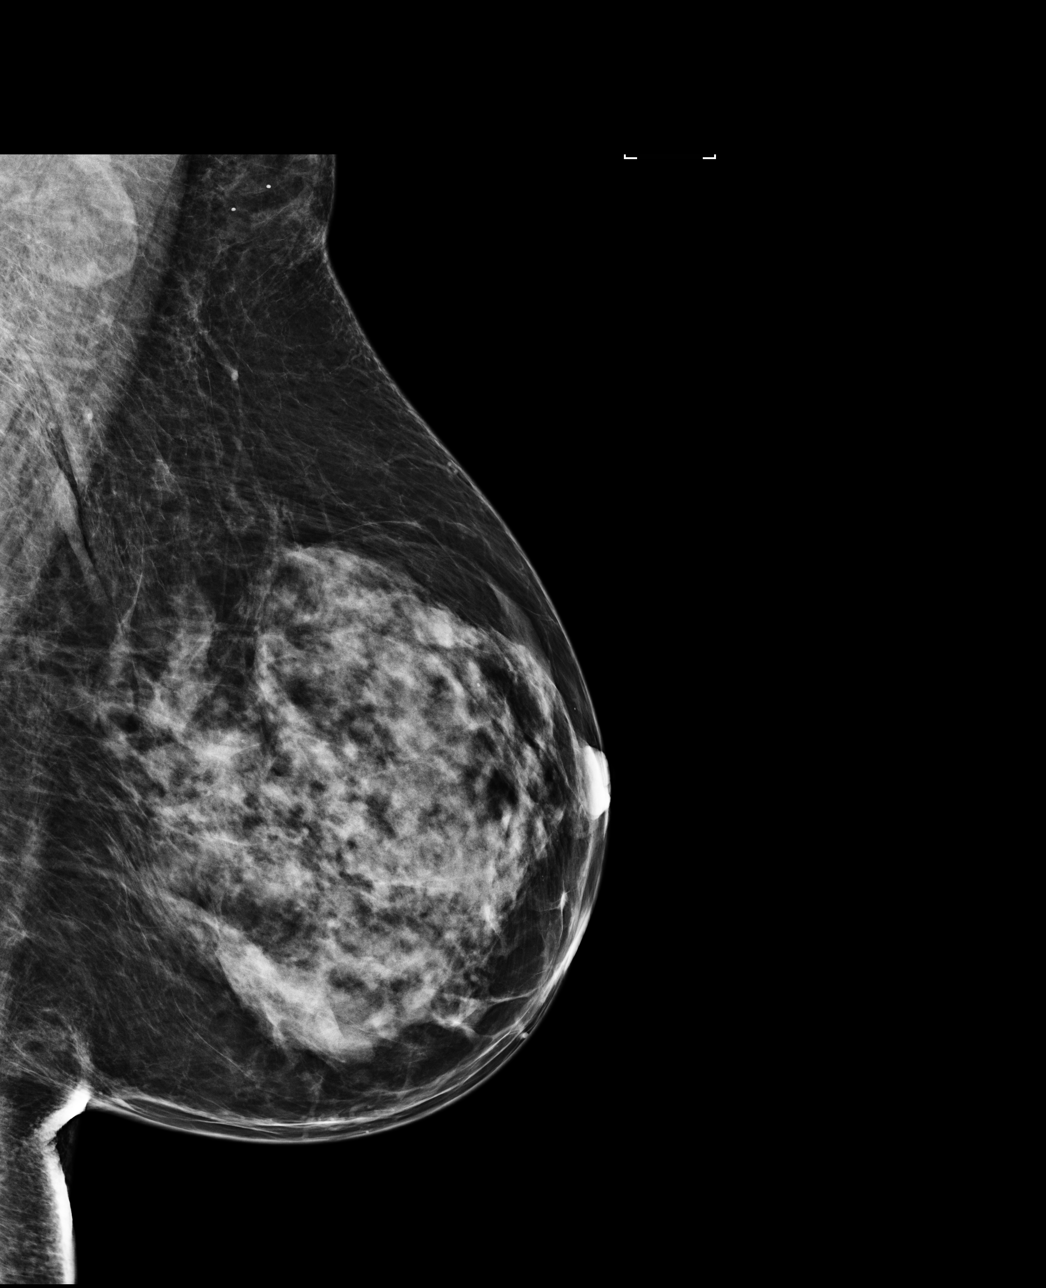

[R CC]
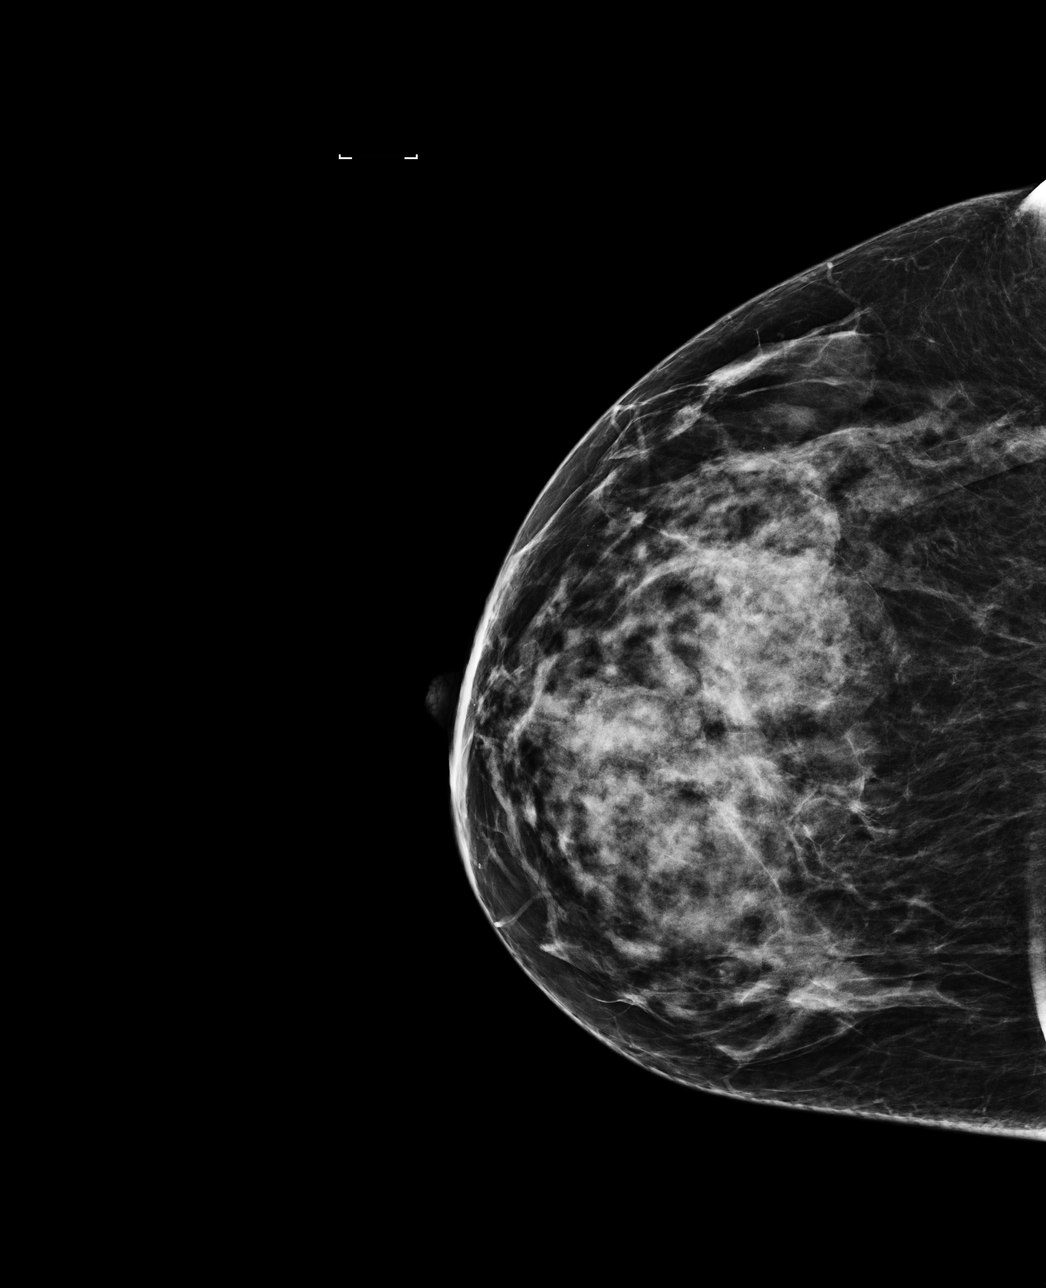

[L CC]
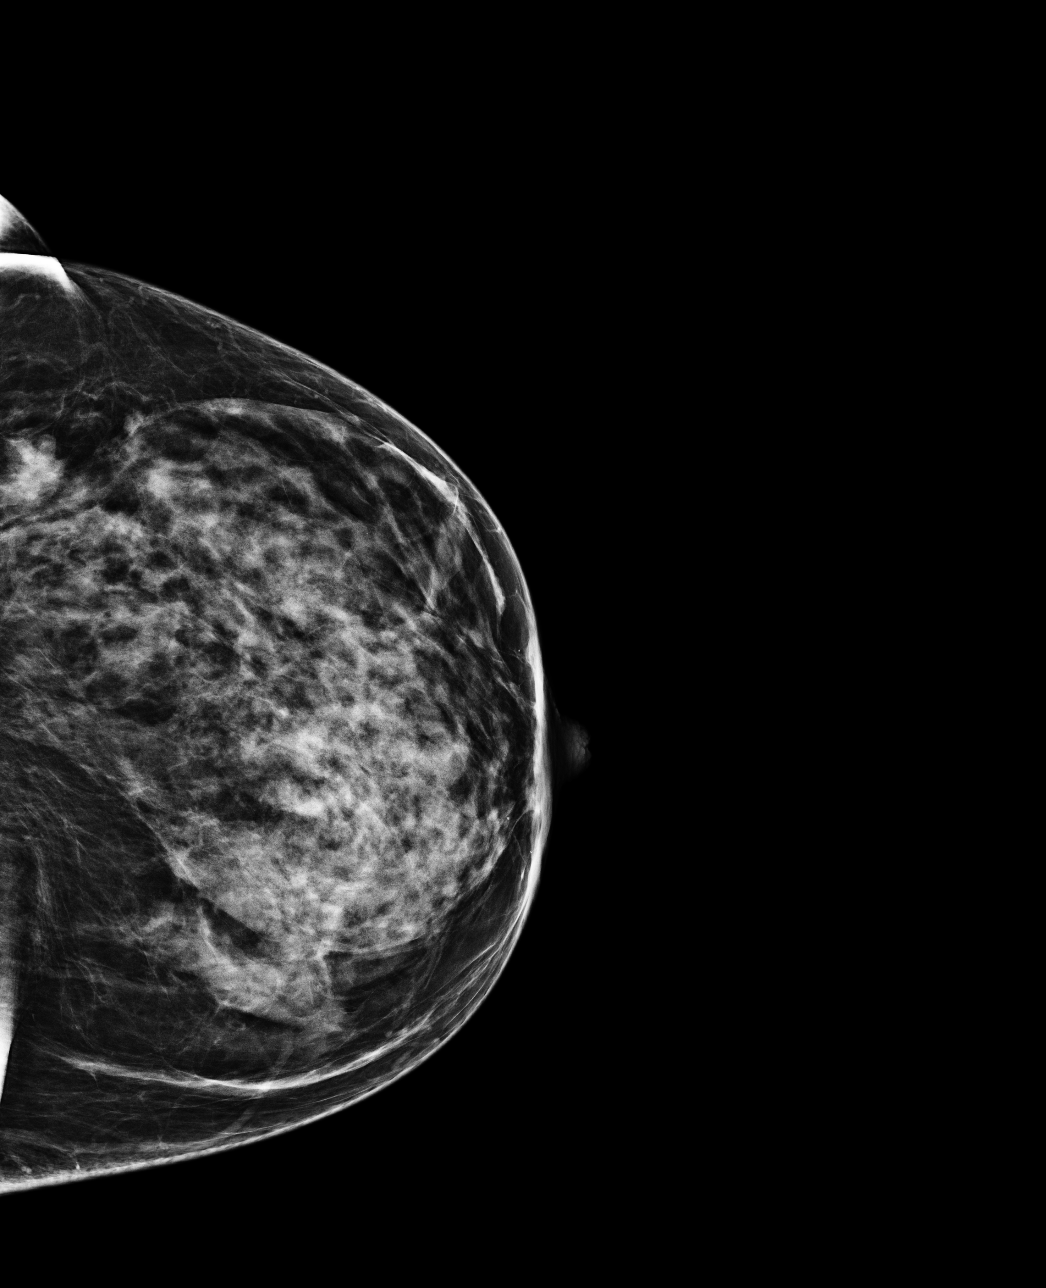

[R MLO]
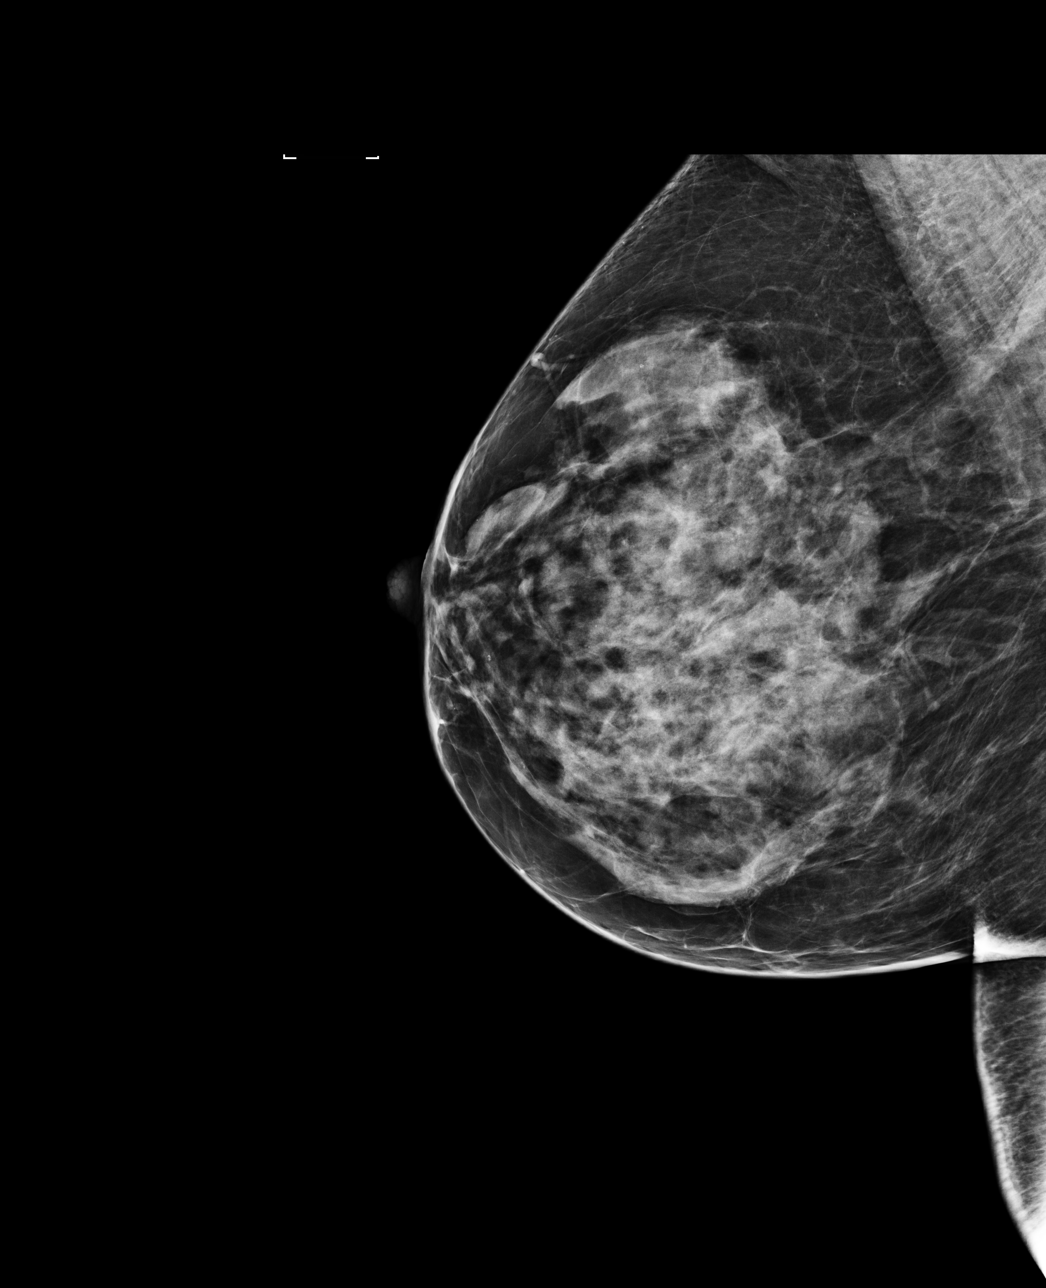

[4 of 4 positions shown; findings below may reference images not displayed]

ACR Breast Density Category d: The breast tissue is extremely dense,
which lowers the sensitivity of mammography
FINDINGS: There are no findings suspicious for malignancy. Images were
processed with CAD.
IMPRESSION: No mammographic evidence of malignancy. A result letter of this
screening mammogram will be mailed directly to the patient.

RECOMMENDATION:
Screening mammogram in one year. (Code:A5-2-HPS)

BI-RADS CATEGORY  1: Negative.

## 2022-07-10 ENCOUNTER — Other Ambulatory Visit: Payer: Self-pay | Admitting: Internal Medicine

## 2022-07-11 LAB — CBC
HCT: 35.8 % (ref 35.0–45.0)
Hemoglobin: 11.4 g/dL — ABNORMAL LOW (ref 11.7–15.5)
MCH: 26.3 pg — ABNORMAL LOW (ref 27.0–33.0)
MCHC: 31.8 g/dL — ABNORMAL LOW (ref 32.0–36.0)
MCV: 82.5 fL (ref 80.0–100.0)
MPV: 9.7 fL (ref 7.5–12.5)
Platelets: 297 10*3/uL (ref 140–400)
RBC: 4.34 10*6/uL (ref 3.80–5.10)
RDW: 14.4 % (ref 11.0–15.0)
WBC: 3.8 10*3/uL (ref 3.8–10.8)

## 2022-07-11 LAB — COMPLETE METABOLIC PANEL WITH GFR
AG Ratio: 1.4 (calc) (ref 1.0–2.5)
ALT: 15 U/L (ref 6–29)
AST: 16 U/L (ref 10–35)
Albumin: 4 g/dL (ref 3.6–5.1)
Alkaline phosphatase (APISO): 95 U/L (ref 37–153)
BUN: 15 mg/dL (ref 7–25)
CO2: 22 mmol/L (ref 20–32)
Calcium: 9.3 mg/dL (ref 8.6–10.4)
Chloride: 107 mmol/L (ref 98–110)
Creat: 0.76 mg/dL (ref 0.50–1.03)
Globulin: 2.9 g/dL (calc) (ref 1.9–3.7)
Glucose, Bld: 90 mg/dL (ref 65–99)
Potassium: 3.8 mmol/L (ref 3.5–5.3)
Sodium: 140 mmol/L (ref 135–146)
Total Bilirubin: 0.4 mg/dL (ref 0.2–1.2)
Total Protein: 6.9 g/dL (ref 6.1–8.1)
eGFR: 91 mL/min/{1.73_m2} (ref >=60)

## 2022-07-11 LAB — VITAMIN D 25 HYDROXY (VIT D DEFICIENCY, FRACTURES): Vit D, 25-Hydroxy: 16 ng/mL — ABNORMAL LOW (ref 30–100)

## 2022-07-11 LAB — LIPID PANEL
Cholesterol: 234 mg/dL — ABNORMAL HIGH (ref <200)
HDL: 59 mg/dL (ref >=50)
LDL Cholesterol (Calc): 149 mg/dL (calc) — ABNORMAL HIGH
Non-HDL Cholesterol (Calc): 175 mg/dL (calc) — ABNORMAL HIGH (ref <130)
Total CHOL/HDL Ratio: 4 (calc) (ref <5.0)
Triglycerides: 137 mg/dL (ref <150)

## 2022-07-11 LAB — TSH: TSH: 1.27 mIU/L (ref 0.40–4.50)

## 2023-02-16 ENCOUNTER — Encounter: Payer: Self-pay | Admitting: *Deleted

## 2023-02-16 NOTE — Progress Notes (Signed)
This client is to follow up with Marliss Coots at [facility] medical clinic.

## 2023-02-17 ENCOUNTER — Encounter (HOSPITAL_COMMUNITY): Payer: Self-pay

## 2023-02-17 ENCOUNTER — Emergency Department (HOSPITAL_COMMUNITY): Payer: Medicaid Other

## 2023-02-17 ENCOUNTER — Emergency Department (HOSPITAL_COMMUNITY)
Admission: EM | Admit: 2023-02-17 | Discharge: 2023-02-17 | Disposition: A | Discharge status: Home / Self Care | Payer: Medicaid Other | Attending: Emergency Medicine | Admitting: Emergency Medicine

## 2023-02-17 ENCOUNTER — Other Ambulatory Visit: Payer: Self-pay

## 2023-02-17 DIAGNOSIS — Z1152 Encounter for screening for COVID-19: Secondary | ICD-10-CM | POA: Diagnosis not present

## 2023-02-17 DIAGNOSIS — J069 Acute upper respiratory infection, unspecified: Secondary | ICD-10-CM | POA: Insufficient documentation

## 2023-02-17 DIAGNOSIS — J45909 Unspecified asthma, uncomplicated: Secondary | ICD-10-CM | POA: Diagnosis not present

## 2023-02-17 DIAGNOSIS — R059 Cough, unspecified: Secondary | ICD-10-CM | POA: Diagnosis present

## 2023-02-17 LAB — BASIC METABOLIC PANEL WITH GFR
Anion gap: 10 (ref 5–15)
BUN: 6 mg/dL (ref 6–20)
CO2: 25 mmol/L (ref 22–32)
Calcium: 9.4 mg/dL (ref 8.9–10.3)
Chloride: 106 mmol/L (ref 98–111)
Creatinine, Ser: 0.77 mg/dL (ref 0.44–1.00)
GFR, Estimated: >60 mL/min
Glucose, Bld: 100 mg/dL — ABNORMAL HIGH (ref 70–99)
Potassium: 3.8 mmol/L (ref 3.5–5.1)
Sodium: 141 mmol/L (ref 135–145)

## 2023-02-17 LAB — RESP PANEL BY RT-PCR (RSV, FLU A&B, COVID)  RVPGX2
Influenza A by PCR: NEGATIVE
Influenza B by PCR: NEGATIVE
Resp Syncytial Virus by PCR: NEGATIVE
SARS Coronavirus 2 by RT PCR: NEGATIVE

## 2023-02-17 LAB — CBC
HCT: 34.4 % — ABNORMAL LOW (ref 36.0–46.0)
Hemoglobin: 11 g/dL — ABNORMAL LOW (ref 12.0–15.0)
MCH: 26.3 pg (ref 26.0–34.0)
MCHC: 32 g/dL (ref 30.0–36.0)
MCV: 82.1 fL (ref 80.0–100.0)
Platelets: 289 10*3/uL (ref 150–400)
RBC: 4.19 MIL/uL (ref 3.87–5.11)
RDW: 14.2 % (ref 11.5–15.5)
WBC: 5.3 10*3/uL (ref 4.0–10.5)
nRBC: 0 % (ref 0.0–0.2)

## 2023-02-17 LAB — GROUP A STREP BY PCR: Group A Strep by PCR: NOT DETECTED

## 2023-02-17 LAB — TROPONIN I (HIGH SENSITIVITY): Troponin I (High Sensitivity): 4 ng/L

## 2023-02-17 MED ORDER — BENZONATATE 100 MG PO CAPS: 100.0000 mg | ORAL_CAPSULE | Freq: Three times a day (TID) | ORAL | 0 refills | Status: AC

## 2023-02-17 NOTE — Discharge Instructions (Addendum)
You were seen today with symptoms consistent with an upper respiratory infection. I have prescribed medication for your cough. Please take as directed. You may also try over the counter medications for symptom relief. Follow up as needed with your primary care provider. If you develop any life threatening symptoms please return to the emergency department

## 2023-02-17 NOTE — ED Triage Notes (Signed)
Cough, congestion, sore throat, and ear pain x 3 days.  Worse this am.

## 2023-02-17 NOTE — ED Provider Notes (Signed)
[facility] Provider Note   CSN: [id number] Arrival date & time: 02/17/23  [id number]     History  Chief Complaint  Patient presents with   Cough   Sore Throat    Daisy Myers is a 57 y.o. female.  Patient presents to the emergency department via EMS complaining of cough, congestion, sore throat, ear pain for the past 3 days which was worse this morning.  She also complains of chest tightness which she rates 10 out of 10 which was worse this morning.  She denies radiation of symptoms.  She denies fevers at home.  She does have a history of asthma and states she has been using her albuterol at home with no relief of symptoms.  Patient also denies abdominal pain, nausea, vomiting.  Past medical history otherwise significant for anxiety, depression, PTSD  HPI     Home Medications Prior to Admission medications   Medication Sig Start Date End Date Taking? Authorizing Provider  benzonatate (TESSALON) 100 MG capsule Take 1 capsule (100 mg total) by mouth every 8 (eight) hours. 02/17/23  Yes Dorothyann Peng, PA-C  albuterol (PROVENTIL HFA;VENTOLIN HFA) 108 (90 Base) MCG/ACT inhaler Inhale 1-2 puffs into the lungs every 4 (four) hours as needed for wheezing or shortness of breath. 07/25/18   Starkes-Perry, Gayland Curry, FNP  albuterol (PROVENTIL HFA;VENTOLIN HFA) 108 (90 Base) MCG/ACT inhaler Inhale 1-2 puffs into the lungs every 4 (four) hours as needed for wheezing or shortness of breath (or cough). 01/25/19   Street, [place], PA-C  DULoxetine 40 MG CPEP Take 40 mg by mouth daily. 07/26/18   Starkes-Perry, Gayland Curry, FNP  gabapentin (NEURONTIN) 100 MG capsule Take 2 capsules (200 mg total) by mouth 3 (three) times daily. 07/25/18   Starkes-Perry, Gayland Curry, FNP  hydrOXYzine (ATARAX/VISTARIL) 25 MG tablet Take 1 tablet (25 mg total) by mouth every 6 (six) hours as needed for anxiety. 07/25/18   Starkes-Perry, Gayland Curry, FNP  ibuprofen (ADVIL,MOTRIN) 600 MG tablet  Take 1 tablet (600 mg total) by mouth every 6 (six) hours as needed. Patient taking differently: Take 600 mg by mouth every 6 (six) hours as needed for moderate pain.  03/15/19   Horton, Barbette Hair, MD  metoCLOPramide (REGLAN) 10 MG tablet Take 1 tablet (10 mg total) by mouth every 6 (six) hours as needed for nausea (nausea/headache). 01/25/19   Street, [place], PA-C  risperiDONE (RISPERDAL) 1 MG tablet Take 1 tablet (1 mg total) by mouth at bedtime. 07/25/18   Starkes-Perry, Gayland Curry, FNP  traZODone (DESYREL) 100 MG tablet Take 1 tablet (100 mg total) by mouth at bedtime as needed for sleep. 07/25/18   Suella Broad, FNP      Allergies    Citrus and Milk-related compounds    Review of Systems   Review of Systems  Constitutional:  Negative for fever.  HENT:  Positive for ear pain, rhinorrhea and sore throat.   Respiratory:  Positive for shortness of breath. Negative for wheezing.   Cardiovascular:  Positive for chest pain.  Gastrointestinal:  Negative for abdominal pain, nausea and vomiting.  Genitourinary:  Negative for dysuria.    Physical Exam Updated Vital Signs BP (!) 157/83   Pulse 65   Temp 98.5 F (36.9 C) (Oral)   Resp 13   Ht 5' (1.524 m)   Wt 75.5 kg   SpO2 99%   BMI 32.51 kg/m  Physical Exam Vitals and nursing note reviewed.  Constitutional:  General: She is not in acute distress. HENT:     Head: Normocephalic.     Right Ear: Tympanic membrane normal.     Left Ear: Tympanic membrane normal.     Mouth/Throat:     Mouth: Mucous membranes are moist.     Pharynx: Uvula midline. Posterior oropharyngeal erythema present. No pharyngeal swelling or oropharyngeal exudate.     Tonsils: No tonsillar exudate or tonsillar abscesses.  Eyes:     Conjunctiva/sclera: Conjunctivae normal.  Cardiovascular:     Rate and Rhythm: Normal rate and regular rhythm.     Heart sounds: Normal heart sounds.  Pulmonary:     Effort: Pulmonary effort is normal. No respiratory  distress.     Breath sounds: Normal breath sounds.  Abdominal:     Palpations: Abdomen is soft.     Tenderness: There is no abdominal tenderness.  Musculoskeletal:     Cervical back: Normal range of motion and neck supple.  Lymphadenopathy:     Cervical: No cervical adenopathy.  Skin:    General: Skin is warm and dry.     Capillary Refill: Capillary refill takes less than 2 seconds.  Neurological:     Mental Status: She is alert.     ED Results / Procedures / Treatments   Labs (all labs ordered are listed, but only abnormal results are displayed) Labs Reviewed  BASIC METABOLIC PANEL - Abnormal; Notable for the following components:      Result Value   Glucose, Bld 100 (*)    All other components within normal limits  CBC - Abnormal; Notable for the following components:   Hemoglobin 11.0 (*)    HCT 34.4 (*)    All other components within normal limits  RESP PANEL BY RT-PCR (RSV, FLU A&B, COVID)  [id number]  GROUP A STREP BY PCR  TROPONIN I (HIGH SENSITIVITY)    EKG EKG Interpretation  Date/Time:  Sunday February 17 2023 07:03:01 EDT Ventricular Rate:  67 PR Interval:  161 QRS Duration: 94 QT Interval:  415 QTC Calculation: 439 R Axis:   72 Text Interpretation: Sinus rhythm Low voltage, precordial leads Confirmed by Dene Gentry [phone]) on 02/17/2023 8:01:38 AM  Radiology DG Chest Port 1 View  Result Date: 02/17/2023 CLINICAL DATA:  57 year old female with chest pain and shortness of breath. Cough, throat and ear pain. EXAM: PORTABLE CHEST 1 VIEW COMPARISON:  Chest radiograph 07/09/2019 and earlier. FINDINGS: Portable AP semi upright view at 0714 hours. Lordotic positioning today. Mediastinal contours remain within normal limits. Visualized tracheal air column is within normal limits. Allowing for portable technique the lungs are clear. No pneumothorax or pleural effusion. Negative visible bowel gas. No acute osseous abnormality identified. IMPRESSION: Negative portable  chest. Electronically Signed   By: Genevie Ann M.D.   On: 02/17/2023 07:27    Procedures Procedures    Medications Ordered in ED Medications - No data to display  ED Course/ Medical Decision Making/ A&P             HEART Score: 2                Medical Decision Making Amount and/or Complexity of Data Reviewed Labs: ordered. Radiology: ordered.   This patient presents to the ED for concern of cough, sore throat, shortness of breath, this involves an extensive number of treatment options, and is a complaint that carries with it a high risk of complications and morbidity.  The differential diagnosis includes upper respiratory infection including COVID-19,  influenza, RSV, and others, pneumonia, ACS, asthma exacerbation, others   Co morbidities that complicate the patient evaluation  Asthma, anxiety   Additional history obtained:  Additional history obtained from EMS   Lab Tests:  I Ordered, and personally interpreted labs.  The pertinent results include: Hemoglobin 11, unremarkable BMP, troponin 4, negative group A strep, negative respiratory panel   Imaging Studies ordered:  I ordered imaging studies including chest x-ray  I independently visualized and interpreted imaging which showed no acute disease I agree with the radiologist interpretation   Cardiac Monitoring: / EKG:  The patient was maintained on a cardiac monitor.  I personally viewed and interpreted the cardiac monitored which showed an underlying rhythm of: Sinus rhythm   Social Determinants of Health:  Patient has Medicaid for her primary insurance   Test / Admission - Considered:  Troponin of 4, nonischemic EKG, low heart score.  Very low clinical suspicion of ACS.  Patient's chief complaint was not chest pain, but she did endorse that during the history.  Patient symptoms at this time seem most consistent with an upper respiratory infection.  No pneumonia on chest x-ray.  No tachycardia to assess  pulmonary embolism.        Final Clinical Impression(s) / ED Diagnoses Final diagnoses:  Upper respiratory tract infection, unspecified type    Rx / DC Orders ED Discharge Orders          Ordered    benzonatate (TESSALON) 100 MG capsule  Every 8 hours        02/17/23 0903              Dorothyann Peng, PA-C 02/17/23 [id number]    Valarie Merino, MD 02/17/23 [phone]

## 2023-02-22 ENCOUNTER — Encounter: Payer: Self-pay | Admitting: *Deleted

## 2023-02-22 NOTE — Progress Notes (Unsigned)
Pt attended 02/16/23 screening event and b/p wnl at that time. Event RN documented pt intended to f/u with Marliss Coots, NP at [facility] sent to [facility] RN to coordinate ongoing PCP and/or other needed care.

## 2023-06-05 ENCOUNTER — Other Ambulatory Visit (HOSPITAL_COMMUNITY)
Admission: RE | Admit: 2023-06-05 | Discharge: 2023-06-05 | Disposition: A | Discharge status: Home / Self Care | Payer: MEDICAID | Source: Ambulatory Visit | Attending: Obstetrics | Admitting: Obstetrics

## 2023-06-05 ENCOUNTER — Ambulatory Visit (INDEPENDENT_AMBULATORY_CARE_PROVIDER_SITE_OTHER): Payer: 59 | Admitting: Obstetrics

## 2023-06-05 ENCOUNTER — Encounter: Payer: Self-pay | Admitting: Obstetrics

## 2023-06-05 VITALS — Ht 60.0 in | Wt 162.0 lb

## 2023-06-05 DIAGNOSIS — Z113 Encounter for screening for infections with a predominantly sexual mode of transmission: Secondary | ICD-10-CM

## 2023-06-05 DIAGNOSIS — N898 Other specified noninflammatory disorders of vagina: Secondary | ICD-10-CM | POA: Diagnosis not present

## 2023-06-05 DIAGNOSIS — Z5320 Procedure and treatment not carried out because of patient's decision for unspecified reasons: Secondary | ICD-10-CM | POA: Diagnosis not present

## 2023-06-05 DIAGNOSIS — Z01419 Encounter for gynecological examination (general) (routine) without abnormal findings: Secondary | ICD-10-CM | POA: Diagnosis present

## 2023-06-05 NOTE — Progress Notes (Unsigned)
Subjective:        Daisy Myers is a 57 y.o. female here for a routine exam.  Current complaints: Vaginal discharge.    Personal health questionnaire:  Is patient Ashkenazi Jewish, have a family history of breast and/or ovarian cancer: no Is there a family history of uterine cancer diagnosed at age < 59, gastrointestinal cancer, urinary tract cancer, family member who is a Personnel officer syndrome-associated carrier: no Is the patient overweight and hypertensive, family history of diabetes, personal history of gestational diabetes, preeclampsia or PCOS: no Is patient over 83, have PCOS,  family history of premature CHD under age 2, diabetes, smoke, have hypertension or peripheral artery disease:  no At any time, has a partner hit, kicked or otherwise hurt or frightened you?: no Over the past 2 weeks, have you felt down, depressed or hopeless?: no Over the past 2 weeks, have you felt little interest or pleasure in doing things?:no   Gynecologic History No LMP recorded. Patient is perimenopausal. Contraception: post menopausal status Last Pap: 2018. Results were: normal Last mammogram: 2020. Results were: normal  Obstetric History OB History  Gravida Para Term Preterm AB Living  2 2 2     2   SAB IAB Ectopic Multiple Live Births          2    # Outcome Date GA Lbr Len/2nd Weight Sex Delivery Anes PTL Lv  2 Term 10/30/90    M CS-LTranv Spinal  LIV  1 Term 02/27/89    M CS-LTranv Spinal  LIV    Past Medical History:  Diagnosis Date   Anxiety    Asthma    Depression    PTSD (post-traumatic stress disorder)     Past Surgical History:  Procedure Laterality Date   BACK SURGERY     BREAST REDUCTION SURGERY     CESAREAN SECTION     TUBAL LIGATION       Current Outpatient Medications:    albuterol (PROVENTIL HFA;VENTOLIN HFA) 108 (90 Base) MCG/ACT inhaler, Inhale 1-2 puffs into the lungs every 4 (four) hours as needed for wheezing or shortness of breath (or cough)., Disp: 1  Inhaler, Rfl: 0   DULoxetine 40 MG CPEP, Take 40 mg by mouth daily., Disp: 30 capsule, Rfl: 0   gabapentin (NEURONTIN) 100 MG capsule, Take 2 capsules (200 mg total) by mouth 3 (three) times daily., Disp: 60 capsule, Rfl: 0   hydrocortisone 2.5 % ointment, Apply topically 2 (two) times daily as needed., Disp: , Rfl:    predniSONE (DELTASONE) 10 MG tablet, Take by mouth., Disp: , Rfl:    traZODone (DESYREL) 100 MG tablet, Take 1 tablet (100 mg total) by mouth at bedtime as needed for sleep., Disp: 30 tablet, Rfl: 0   albuterol (PROVENTIL HFA;VENTOLIN HFA) 108 (90 Base) MCG/ACT inhaler, Inhale 1-2 puffs into the lungs every 4 (four) hours as needed for wheezing or shortness of breath. (Patient not taking: Reported on 06/05/2023), Disp: 1 Inhaler, Rfl: 0   benzonatate (TESSALON) 100 MG capsule, Take 1 capsule (100 mg total) by mouth every 8 (eight) hours. (Patient not taking: Reported on 06/05/2023), Disp: 21 capsule, Rfl: 0   hydrOXYzine (ATARAX/VISTARIL) 25 MG tablet, Take 1 tablet (25 mg total) by mouth every 6 (six) hours as needed for anxiety. (Patient not taking: Reported on 06/05/2023), Disp: 30 tablet, Rfl: 0   ibuprofen (ADVIL,MOTRIN) 600 MG tablet, Take 1 tablet (600 mg total) by mouth every 6 (six) hours as needed. (Patient taking differently: Take  600 mg by mouth every 6 (six) hours as needed for moderate pain. ), Disp: 30 tablet, Rfl: 0   metoCLOPramide (REGLAN) 10 MG tablet, Take 1 tablet (10 mg total) by mouth every 6 (six) hours as needed for nausea (nausea/headache). (Patient not taking: Reported on 06/05/2023), Disp: 12 tablet, Rfl: 0   risperiDONE (RISPERDAL) 1 MG tablet, Take 1 tablet (1 mg total) by mouth at bedtime. (Patient not taking: Reported on 06/05/2023), Disp: 30 tablet, Rfl: 0 Allergies  Allergen Reactions   Citrus     Rash all over   Milk-Related Compounds Rash    Rash     Social History   Tobacco Use   Smoking status: Never   Smokeless tobacco: Never  Substance Use  Topics   Alcohol use: No    Family History  Adopted: Yes      Review of Systems  Constitutional: negative for fatigue and weight loss Respiratory: negative for cough and wheezing Cardiovascular: negative for chest pain, fatigue and palpitations Gastrointestinal: negative for abdominal pain and change in bowel habits Musculoskeletal:negative for myalgias Neurological: negative for gait problems and tremors Behavioral/Psych: negative for abusive relationship, depression Endocrine: negative for temperature intolerance    Genitourinary: positive for vaginal dischargenegative for abnormal menstrual periods, genital lesions, hot flashes, sexual problems Integument/breast: negative for breast lump, breast tenderness, nipple discharge and skin lesion(s)    Objective:       Ht 5' (1.524 m)   Wt 162 lb (73.5 kg)   BMI 31.64 kg/m  General:   Alert and no distress  Skin:   no rash or abnormalities  Lungs:   clear to auscultation bilaterally  Heart:   regular rate and rhythm, S1, S2 normal, no murmur, click, rub or gallop  Breasts:   normal without suspicious masses, skin or nipple changes or axillary nodes  Abdomen:  normal findings: no organomegaly, soft, non-tender and no hernia  Pelvis:  External genitalia: normal general appearance Urinary system: urethral meatus normal and bladder without fullness, nontender Vaginal: normal without tenderness, induration or masses Cervix: normal appearance Adnexa: normal bimanual exam Uterus: anteverted and non-tender, normal size   Lab Review Urine pregnancy test Labs reviewed yes Radiologic studies reviewed yes  I have spent a total of 20 minutes of face-to-face time, excluding clinical staff time, reviewing notes and preparing to see patient, ordering tests and/or medications, and counseling the patient.   Assessment:    Healthy female exam.    Plan:    {plan:19193}   No orders of the defined types were placed in this  encounter.  Orders Placed This Encounter  Procedures   MM Digital Screening    Standing Status:   Future    Standing Expiration Date:   06/04/2024    Order Specific Question:   Reason for Exam (SYMPTOM  OR DIAGNOSIS REQUIRED)    Answer:   Screening    Order Specific Question:   Is the patient pregnant?    Answer:   No    Order Specific Question:   Preferred imaging location?    Answer:   [facility]   HIV antibody (with reflex)   Hepatitis C Antibody   Hepatitis B Surface AntiGEN   RPR    Brock Bad, MD 06/05/2023 3:42 PM

## 2023-06-06 ENCOUNTER — Other Ambulatory Visit: Payer: Self-pay | Admitting: Obstetrics

## 2023-06-06 DIAGNOSIS — N760 Acute vaginitis: Secondary | ICD-10-CM

## 2023-06-06 LAB — CERVICOVAGINAL ANCILLARY ONLY
Bacterial Vaginitis (gardnerella): POSITIVE — AB
Candida Glabrata: NEGATIVE
Candida Vaginitis: NEGATIVE
Comment: NEGATIVE
Comment: NEGATIVE
Comment: NEGATIVE

## 2023-06-06 LAB — HEPATITIS B SURFACE ANTIGEN: Hepatitis B Surface Ag: NEGATIVE

## 2023-06-06 LAB — RPR: RPR Ser Ql: NONREACTIVE

## 2023-06-06 LAB — HEPATITIS C ANTIBODY: Hep C Virus Ab: NONREACTIVE

## 2023-06-06 LAB — HIV ANTIBODY (ROUTINE TESTING W REFLEX): HIV Screen 4th Generation wRfx: NONREACTIVE

## 2023-06-06 MED ORDER — METRONIDAZOLE 500 MG PO TABS: 500.0000 mg | ORAL_TABLET | Freq: Two times a day (BID) | ORAL | 2 refills | Status: AC

## 2023-06-10 ENCOUNTER — Ambulatory Visit
Admission: RE | Admit: 2023-06-10 | Discharge: 2023-06-10 | Disposition: A | Discharge status: Home / Self Care | Payer: 59 | Source: Ambulatory Visit | Attending: Obstetrics | Admitting: Obstetrics

## 2023-06-10 ENCOUNTER — Ambulatory Visit: Payer: 59

## 2023-06-10 DIAGNOSIS — Z5320 Procedure and treatment not carried out because of patient's decision for unspecified reasons: Secondary | ICD-10-CM

## 2023-06-10 LAB — CYTOLOGY - PAP
Adequacy: ABSENT
Comment: NEGATIVE
Comment: NEGATIVE
Comment: NEGATIVE
Diagnosis: UNDETERMINED — AB
HPV 16: NEGATIVE
HPV 18 / 45: POSITIVE — AB
High risk HPV: POSITIVE — AB

## 2023-06-21 ENCOUNTER — Telehealth: Payer: Self-pay | Admitting: Emergency Medicine

## 2023-06-21 NOTE — Telephone Encounter (Signed)
Pt informed of results, colpo to be scheduled.

## 2023-06-21 NOTE — Telephone Encounter (Signed)
-----   Message from Reva Bores sent at 06/19/2023  8:07 AM EDT ----- Needs colpo

## 2023-07-30 ENCOUNTER — Encounter: Payer: 59 | Admitting: Obstetrics and Gynecology

## 2023-07-30 ENCOUNTER — Other Ambulatory Visit: Payer: Self-pay | Admitting: Internal Medicine

## 2023-07-31 LAB — LIPID PANEL
Cholesterol: 227 mg/dL — ABNORMAL HIGH (ref <200)
HDL: 66 mg/dL (ref >=50)
LDL Cholesterol (Calc): 140 mg/dL — ABNORMAL HIGH
Non-HDL Cholesterol (Calc): 161 mg/dL — ABNORMAL HIGH (ref <130)
Total CHOL/HDL Ratio: 3.4 (calc) (ref <5.0)
Triglycerides: 101 mg/dL (ref <150)

## 2023-07-31 LAB — COMPLETE METABOLIC PANEL WITH GFR
AG Ratio: 1.5 (calc) (ref 1.0–2.5)
ALT: 15 U/L (ref 6–29)
AST: 15 U/L (ref 10–35)
Albumin: 4.1 g/dL (ref 3.6–5.1)
Alkaline phosphatase (APISO): 97 U/L (ref 37–153)
BUN: 14 mg/dL (ref 7–25)
CO2: 24 mmol/L (ref 20–32)
Calcium: 9.5 mg/dL (ref 8.6–10.4)
Chloride: 105 mmol/L (ref 98–110)
Creat: 0.79 mg/dL (ref 0.50–1.03)
Globulin: 2.8 g/dL (ref 1.9–3.7)
Glucose, Bld: 70 mg/dL (ref 65–99)
Potassium: 4.1 mmol/L (ref 3.5–5.3)
Sodium: 140 mmol/L (ref 135–146)
Total Bilirubin: 0.4 mg/dL (ref 0.2–1.2)
Total Protein: 6.9 g/dL (ref 6.1–8.1)
eGFR: 87 mL/min/{1.73_m2} (ref >=60)

## 2023-07-31 LAB — CBC
HCT: 37.4 % (ref 35.0–45.0)
Hemoglobin: 11.7 g/dL (ref 11.7–15.5)
MCH: 25.9 pg — ABNORMAL LOW (ref 27.0–33.0)
MCHC: 31.3 g/dL — ABNORMAL LOW (ref 32.0–36.0)
MCV: 82.9 fL (ref 80.0–100.0)
MPV: 10.2 fL (ref 7.5–12.5)
Platelets: 311 10*3/uL (ref 140–400)
RBC: 4.51 10*6/uL (ref 3.80–5.10)
RDW: 14.5 % (ref 11.0–15.0)
WBC: 4.5 10*3/uL (ref 3.8–10.8)

## 2023-07-31 LAB — VITAMIN D 25 HYDROXY (VIT D DEFICIENCY, FRACTURES): Vit D, 25-Hydroxy: 20 ng/mL — ABNORMAL LOW (ref 30–100)

## 2023-07-31 LAB — TSH: TSH: 1.54 m[IU]/L (ref 0.40–4.50)
